# Patient Record
Sex: Female | Born: 1990 | Race: White | Hispanic: No | Marital: Single | Smoking: Current every day smoker
Health system: Southern US, Community
[De-identification: ages and names within clinical notes are randomized; demographics above are authoritative.]

## PROBLEM LIST (undated history)

## (undated) ENCOUNTER — Inpatient Hospital Stay (HOSPITAL_COMMUNITY): Payer: Self-pay

## (undated) DIAGNOSIS — B009 Herpesviral infection, unspecified: Secondary | ICD-10-CM

## (undated) DIAGNOSIS — I493 Ventricular premature depolarization: Secondary | ICD-10-CM

## (undated) HISTORY — PX: OTHER SURGICAL HISTORY: SHX169

---
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
## 2011-07-06 ENCOUNTER — Emergency Department (HOSPITAL_COMMUNITY)
Admission: EM | Admit: 2011-07-06 | Discharge: 2011-07-06 | Disposition: A | Discharge status: Home / Self Care | Payer: Self-pay | Attending: Emergency Medicine | Admitting: Emergency Medicine

## 2011-07-06 DIAGNOSIS — N739 Female pelvic inflammatory disease, unspecified: Secondary | ICD-10-CM | POA: Insufficient documentation

## 2011-07-06 DIAGNOSIS — R109 Unspecified abdominal pain: Secondary | ICD-10-CM | POA: Insufficient documentation

## 2011-07-06 LAB — URINALYSIS, ROUTINE W REFLEX MICROSCOPIC
Bilirubin Urine: NEGATIVE
Hgb urine dipstick: NEGATIVE
Ketones, ur: NEGATIVE mg/dL
Nitrite: NEGATIVE
Protein, ur: NEGATIVE mg/dL
Specific Gravity, Urine: 1.019 (ref 1.005–1.030)
Urobilinogen, UA: 1 mg/dL (ref 0.0–1.0)

## 2011-07-06 LAB — DIFFERENTIAL
Basophils Absolute: 0 10*3/uL (ref 0.0–0.1)
Basophils Relative: 0 % (ref 0–1)
Lymphocytes Relative: 17 % (ref 12–46)
Monocytes Relative: 8 % (ref 3–12)
Neutro Abs: 7.2 10*3/uL (ref 1.7–7.7)
Neutrophils Relative %: 73 % (ref 43–77)

## 2011-07-06 LAB — CBC
Hemoglobin: 12.9 g/dL (ref 12.0–15.0)
MCH: 27.7 pg (ref 26.0–34.0)
MCHC: 32.9 g/dL (ref 30.0–36.0)
MCV: 84.1 fL (ref 78.0–100.0)
Platelets: 288 10*3/uL (ref 150–400)
RBC: 4.66 MIL/uL (ref 3.87–5.11)
RDW: 12.4 % (ref 11.5–15.5)

## 2011-07-06 LAB — POCT I-STAT, CHEM 8
BUN: 14 mg/dL (ref 6–23)
Potassium: 4.4 mEq/L (ref 3.5–5.1)
Sodium: 139 mEq/L (ref 135–145)
TCO2: 26 mmol/L (ref 0–100)

## 2011-07-06 LAB — WET PREP, GENITAL
Trich, Wet Prep: NONE SEEN
Yeast Wet Prep HPF POC: NONE SEEN

## 2011-07-06 LAB — POCT PREGNANCY, URINE: Preg Test, Ur: NEGATIVE

## 2011-07-07 LAB — URINE CULTURE: Culture: NO GROWTH

## 2011-07-08 LAB — GC/CHLAMYDIA PROBE AMP, GENITAL: Chlamydia, DNA Probe: NEGATIVE

## 2012-04-04 ENCOUNTER — Encounter (HOSPITAL_COMMUNITY): Payer: Self-pay | Admitting: Family Medicine

## 2012-04-04 ENCOUNTER — Emergency Department (HOSPITAL_COMMUNITY)
Admission: EM | Admit: 2012-04-04 | Discharge: 2012-04-04 | Disposition: A | Discharge status: Home / Self Care | Payer: Self-pay | Attending: Emergency Medicine | Admitting: Emergency Medicine

## 2012-04-04 DIAGNOSIS — Z79899 Other long term (current) drug therapy: Secondary | ICD-10-CM | POA: Insufficient documentation

## 2012-04-04 DIAGNOSIS — N760 Acute vaginitis: Secondary | ICD-10-CM | POA: Insufficient documentation

## 2012-04-04 DIAGNOSIS — A499 Bacterial infection, unspecified: Secondary | ICD-10-CM | POA: Insufficient documentation

## 2012-04-04 DIAGNOSIS — L293 Anogenital pruritus, unspecified: Secondary | ICD-10-CM | POA: Insufficient documentation

## 2012-04-04 DIAGNOSIS — B9689 Other specified bacterial agents as the cause of diseases classified elsewhere: Secondary | ICD-10-CM | POA: Insufficient documentation

## 2012-04-04 DIAGNOSIS — F17200 Nicotine dependence, unspecified, uncomplicated: Secondary | ICD-10-CM | POA: Insufficient documentation

## 2012-04-04 HISTORY — DX: Herpesviral infection, unspecified: B00.9

## 2012-04-04 LAB — WET PREP, GENITAL
Trich, Wet Prep: NONE SEEN
Yeast Wet Prep HPF POC: NONE SEEN

## 2012-04-04 MED ORDER — METRONIDAZOLE 500 MG PO TABS: 500.0000 mg | ORAL_TABLET | Freq: Two times a day (BID) | ORAL | Status: AC

## 2012-04-04 NOTE — ED Notes (Signed)
Pt sts hx of herpes x1 year. sts had a vaginal exam 2 weeks ago and since has had a terrible outbreak. sts has been taking valtrex. sts also left shoulder pain that started less than one month ago.

## 2012-04-04 NOTE — Discharge Instructions (Signed)
You likely have bacterial vaginosis, an overgrowth of the natural bacteria that colonize the vagina. You do not appear to have an active herpes outbreak at this time. You have been prescribed an antibiotic to treat this. Take ALL of this until gone. Do not drink alcohol while taking this as it will cause you to have a reaction of facial flushing and discomfort. Follow up with the health department if not improving.  Bacterial Vaginosis Bacterial vaginosis (BV) is a vaginal infection where the normal balance of bacteria in the vagina is disrupted. The normal balance is then replaced by an overgrowth of certain bacteria. There are several different kinds of bacteria that can cause BV. BV is the most common vaginal infection in women of childbearing age. CAUSES   The cause of BV is not fully understood. BV develops when there is an increase or imbalance of harmful bacteria.   Some activities or behaviors can upset the normal balance of bacteria in the vagina and put women at increased risk including:   Having a new sex partner or multiple sex partners.   Douching.   Using an intrauterine device (IUD) for contraception.   It is not clear what role sexual activity plays in the development of BV. However, women that have never had sexual intercourse are rarely infected with BV.  Women do not get BV from toilet seats, bedding, swimming pools or from touching objects around them.  SYMPTOMS   Grey vaginal discharge.   A fish-like odor with discharge, especially after sexual intercourse.   Itching or burning of the vagina and vulva.   Burning or pain with urination.   Some women have no signs or symptoms at all.  DIAGNOSIS  Your caregiver must examine the vagina for signs of BV. Your caregiver will perform lab tests and look at the sample of vaginal fluid through a microscope. They will look for bacteria and abnormal cells (clue cells), a pH test higher than 4.5, and a positive amine test all  associated with BV.  RISKS AND COMPLICATIONS   Pelvic inflammatory disease (PID).   Infections following gynecology surgery.   Developing HIV.   Developing herpes virus.  TREATMENT  Sometimes BV will clear up without treatment. However, all women with symptoms of BV should be treated to avoid complications, especially if gynecology surgery is planned. Female partners generally do not need to be treated. However, BV may spread between female sex partners so treatment is helpful in preventing a recurrence of BV.   BV may be treated with antibiotics. The antibiotics come in either pill or vaginal cream forms. Either can be used with nonpregnant or pregnant women, but the recommended dosages differ. These antibiotics are not harmful to the baby.   BV can recur after treatment. If this happens, a second round of antibiotics will often be prescribed.   Treatment is important for pregnant women. If not treated, BV can cause a premature delivery, especially for a pregnant woman who had a premature birth in the past. All pregnant women who have symptoms of BV should be checked and treated.   For chronic reoccurrence of BV, treatment with a type of prescribed gel vaginally twice a week is helpful.  HOME CARE INSTRUCTIONS   Finish all medication as directed by your caregiver.   Do not have sex until treatment is completed.   Tell your sexual partner that you have a vaginal infection. They should see their caregiver and be treated if they have problems, such as  a mild rash or itching.   Practice safe sex. Use condoms. Only have 1 sex partner.  PREVENTION  Basic prevention steps can help reduce the risk of upsetting the natural balance of bacteria in the vagina and developing BV:  Do not have sexual intercourse (be abstinent).   Do not douche.   Use all of the medicine prescribed for treatment of BV, even if the signs and symptoms go away.   Tell your sex partner if you have BV. That way, they  can be treated, if needed, to prevent reoccurrence.  SEEK MEDICAL CARE IF:   Your symptoms are not improving after 3 days of treatment.   You have increased discharge, pain, or fever.  MAKE SURE YOU:   Understand these instructions.   Will watch your condition.   Will get help right away if you are not doing well or get worse.  FOR MORE INFORMATION  Division of STD Prevention (DSTDP), Centers for Disease Control and Prevention: [employer] American Social Health Association (ASHA): www.ashastd.org  Document Released: 10/27/2005 Document Revised: 10/16/2011 Document Reviewed: 04/19/2009 [facility] Patient Information 2012 [place], [place].

## 2012-04-04 NOTE — ED Provider Notes (Signed)
Medical screening examination/treatment/procedure(s) were performed by non-physician practitioner and as supervising physician I was immediately available for consultation/collaboration.   Lyanne Co, MD 04/04/12 1721

## 2012-04-04 NOTE — ED Provider Notes (Signed)
History     CSN: [id number]  Arrival date & time 04/04/12  1230   First MD Initiated Contact with Patient 04/04/12 1327      Chief Complaint  Patient presents with  . Herpes Zoster    (Consider location/radiation/quality/duration/timing/severity/associated sxs/prior treatment) HPI History from patient. 21 year old female with past medical history of genital herpes simplex presents with pain and pruritis to the vaginal area. She states this started about 2 weeks ago after a pelvic exam which was performed at the health department. She thought that she may be having a herpes outbreak so took her typical "outbreak dose" of Valtrex, but has not had any improvement. Has not noted any vaginal discharge. Has not had any associated abdominal pain, nausea, vomiting. No new soaps or detergents. She is sexually active with a female partner who she states has tested negative for STIs.  Past Medical History  Diagnosis Date  . Herpes     History reviewed. No pertinent past surgical history.  History reviewed. No pertinent family history.  History  Substance Use Topics  . Smoking status: Current Some Day Smoker  . Smokeless tobacco: Not on file  . Alcohol Use: Yes    OB History    Grav Para Term Preterm Abortions TAB SAB Ect Mult Living                  Review of Systems as per history of present illness  Allergies  Review of patient's allergies indicates no known allergies.  Home Medications   Current Outpatient Rx  Name Route Sig Dispense Refill  . NAPROXEN SODIUM 220 MG PO TABS Oral Take 220 mg by mouth daily as needed. For back pain    . VALACYCLOVIR HCL 1 G PO TABS Oral Take 500 mg by mouth daily. Increase to 1/2 tablet 2 times daily for outbreaks      BP 123/60  Pulse 55  Temp(Src) 98.5 F (36.9 C) (Oral)  Resp 19  SpO2 99%  LMP 03/21/2012  Physical Exam  Nursing note and vitals reviewed. Constitutional: She appears well-developed and well-nourished. No distress.   HENT:  Head: Normocephalic and atraumatic.  Neck: Normal range of motion.  Cardiovascular: Normal rate, regular rhythm and normal heart sounds.   Pulmonary/Chest: Effort normal and breath sounds normal.  Abdominal: Soft. Bowel sounds are normal. There is no tenderness. There is no rebound and no guarding.  Genitourinary:       RN chaperone present during exam  No active genital lesions on exam. External vaginal mucosa appears mildly dry. Small amount of physiologic-appearing discharge in vaginal vault. Cervix is not erythematous.  Musculoskeletal: Normal range of motion.  Neurological: She is alert.  Skin: Skin is warm and dry. She is not diaphoretic.  Psychiatric: She has a normal mood and affect.    ED Course  Procedures (including critical care time)  Labs Reviewed - No data to display No results found.   1. BV (bacterial vaginosis)       MDM  Patient with history of herpes simplex of the genitals presents with vaginal itching and pain. On exam, she is not noted to have an active herpes outbreak, but the external genitalia appear mildly dry. Wet prep with few clue cells. Will treat as possible BV. Instructed to followup with the health department if not improving. Reasons to return to the emergency department discussed.        Grant Fontana, [place] 04/04/12 1529

## 2012-11-06 ENCOUNTER — Encounter (HOSPITAL_COMMUNITY): Payer: Self-pay | Admitting: Emergency Medicine

## 2012-11-06 ENCOUNTER — Emergency Department (HOSPITAL_COMMUNITY)
Admission: EM | Admit: 2012-11-06 | Discharge: 2012-11-07 | Disposition: A | Discharge status: Home / Self Care | Payer: Self-pay | Attending: Emergency Medicine | Admitting: Emergency Medicine

## 2012-11-06 DIAGNOSIS — F17200 Nicotine dependence, unspecified, uncomplicated: Secondary | ICD-10-CM | POA: Insufficient documentation

## 2012-11-06 DIAGNOSIS — R112 Nausea with vomiting, unspecified: Secondary | ICD-10-CM | POA: Insufficient documentation

## 2012-11-06 DIAGNOSIS — B009 Herpesviral infection, unspecified: Secondary | ICD-10-CM | POA: Insufficient documentation

## 2012-11-06 DIAGNOSIS — Z3202 Encounter for pregnancy test, result negative: Secondary | ICD-10-CM | POA: Insufficient documentation

## 2012-11-06 DIAGNOSIS — Z79899 Other long term (current) drug therapy: Secondary | ICD-10-CM | POA: Insufficient documentation

## 2012-11-06 LAB — POCT PREGNANCY, URINE: Preg Test, Ur: NEGATIVE

## 2012-11-06 NOTE — ED Notes (Signed)
Pt states she thinks she might be pregnant   Pt states she is having sxs like she is  Pt is requesting to have a blood test for it   Pt states her sxs include nausea, vomiting, back ache, shaking, breast enlargement

## 2012-11-07 LAB — COMPREHENSIVE METABOLIC PANEL
ALT: 16 U/L (ref 0–35)
AST: 17 U/L (ref 0–37)
Albumin: 4 g/dL (ref 3.5–5.2)
Alkaline Phosphatase: 48 U/L (ref 39–117)
CO2: 23 mEq/L (ref 19–32)
Chloride: 103 mEq/L (ref 96–112)
Potassium: 3.7 mEq/L (ref 3.5–5.1)
Total Bilirubin: 0.4 mg/dL (ref 0.3–1.2)

## 2012-11-07 LAB — CBC WITH DIFFERENTIAL/PLATELET
Basophils Absolute: 0 10*3/uL (ref 0.0–0.1)
Basophils Relative: 0 % (ref 0–1)
Hemoglobin: 14.5 g/dL (ref 12.0–15.0)
Lymphocytes Relative: 26 % (ref 12–46)
MCHC: 35.2 g/dL (ref 30.0–36.0)
Neutro Abs: 6.2 10*3/uL (ref 1.7–7.7)
Neutrophils Relative %: 61 % (ref 43–77)
RDW: 12.9 % (ref 11.5–15.5)
WBC: 10.2 10*3/uL (ref 4.0–10.5)

## 2012-11-07 LAB — URINALYSIS, ROUTINE W REFLEX MICROSCOPIC
Bilirubin Urine: NEGATIVE
Hgb urine dipstick: NEGATIVE
Ketones, ur: NEGATIVE mg/dL
Nitrite: NEGATIVE
Specific Gravity, Urine: 1.01 (ref 1.005–1.030)
Urobilinogen, UA: 0.2 mg/dL (ref 0.0–1.0)

## 2012-11-07 LAB — WET PREP, GENITAL: Trich, Wet Prep: NONE SEEN

## 2012-11-07 MED ORDER — PROMETHAZINE HCL 25 MG PO TABS: 25.0000 mg | ORAL_TABLET | Freq: Four times a day (QID) | ORAL | Status: DC | PRN

## 2012-11-07 MED ORDER — ONDANSETRON 8 MG PO TBDP
8.0000 mg | ORAL_TABLET | Freq: Once | ORAL | Status: AC
  Administered 2012-11-07: 8 mg via ORAL
  Filled 2012-11-07: qty 1

## 2012-11-07 MED ORDER — GI COCKTAIL ~~LOC~~
30.0000 mL | Freq: Once | ORAL | Status: AC
  Administered 2012-11-07: 30 mL via ORAL
  Filled 2012-11-07: qty 30

## 2012-11-07 NOTE — ED Provider Notes (Signed)
History     CSN: [id number]  Arrival date & time 11/06/12  2141   First MD Initiated Contact with Patient 11/07/12 0016      Chief Complaint  Patient presents with  . Vomiting     (Consider location/radiation/quality/duration/timing/severity/associated sxs/prior treatment) HPI History provided by pt.   Pt c/o nausea and several episodes of vomiting over the past 2 weeks.  Occurs whenever she smells food.  Believes she may be pregnant.  Associated w/ mid-line and left lower abdominal pain, diffuse low back pain, as well as blood in stool (on tp only).  Attributes blood in stool to perianal irritation from frequent BMs and wiping.  Denies fever, hematemesis, diarrhea.  Has had increased urinary frequency and dysuria, but attributes dysuria to herpes breakout.  LMP 09/15/12 and periods normally regular.  No other GU sx.   Past Medical History  Diagnosis Date  . Herpes     Past Surgical History  Procedure Date  . Extraction of wisdom teeth     History reviewed. No pertinent family history.  History  Substance Use Topics  . Smoking status: Current Every Day Smoker    Types: Cigarettes  . Smokeless tobacco: Not on file  . Alcohol Use: Yes     Comment: occ    OB History    Grav Para Term Preterm Abortions TAB SAB Ect Mult Living                  Review of Systems  All other systems reviewed and are negative.    Allergies  Review of patient's allergies indicates no known allergies.  Home Medications   Current Outpatient Rx  Name  Route  Sig  Dispense  Refill  . NAPROXEN SODIUM 220 MG PO TABS   Oral   Take 220 mg by mouth daily as needed. For back pain         . VALACYCLOVIR HCL 1 G PO TABS   Oral   Take 500 mg by mouth daily. Increase to 1/2 tablet 2 times daily for outbreaks           BP 138/73  Pulse 104  Temp 98.8 F (37.1 C) (Oral)  Resp 16  SpO2 99%  LMP 09/19/2012  Physical Exam  Nursing note and vitals reviewed. Constitutional: She is  oriented to person, place, and time. She appears well-developed and well-nourished. No distress.  HENT:  Head: Normocephalic and atraumatic.  Eyes:       Normal appearance  Neck: Normal range of motion.  Cardiovascular: Normal rate and regular rhythm.   Pulmonary/Chest: Effort normal and breath sounds normal. No respiratory distress.  Abdominal: Soft. Bowel sounds are normal. She exhibits no distension and no mass. There is no rebound and no guarding.       Mild epigastric, suprapubic and LLQ ttp.    Genitourinary:       Mild R CVA tenderness.  No external hemorrhoids. Nml rectal tone.  No rectal ttp.  Nml stool color w/out gross blood.    Musculoskeletal: Normal range of motion.  Neurological: She is alert and oriented to person, place, and time.  Skin: Skin is warm and dry. No rash noted.  Psychiatric: She has a normal mood and affect. Her behavior is normal.    ED Course  Procedures (including critical care time)  Labs Reviewed  URINALYSIS, ROUTINE W REFLEX MICROSCOPIC - Abnormal; Notable for the following:    APPearance CLOUDY (*)     All other  components within normal limits  WET PREP, GENITAL - Abnormal; Notable for the following:    Clue Cells Wet Prep HPF POC RARE (*)     WBC, Wet Prep HPF POC FEW (*)     All other components within normal limits  POCT PREGNANCY, URINE  CBC WITH DIFFERENTIAL  COMPREHENSIVE METABOLIC PANEL  LIPASE, BLOOD  GC/CHLAMYDIA PROBE AMP   No results found.   1. Nausea and vomiting       MDM  Healthy 21yo F presents w/ N/V, lower abdominal pain, hematochezia and amenorrhea.  Believes she may be pregnant.  On exam, afebrile, mildly tachycardic, mild epigastric/suprapubic/LLQ ttp, unremarkable genitalia and nml rectum.  No vomiting in ED and sx improvement w/ GI cocktail and zofran.  Labs unremarkable, including neg hemoccult, U/A and urine preg.  Results discussed w/ pt.  Recommended repeat home pregnancy test in 1-2 weeks.  Prescribed  promethazine.  Referred to GI and Gyn.  Return precautions discussed.         Otilio Miu, PA-C 11/07/12 4540  Otilio Miu, PA-C 11/07/12 [phone]

## 2012-11-07 NOTE — ED Provider Notes (Signed)
Medical screening examination/treatment/procedure(s) were performed by non-physician practitioner and as supervising physician I was immediately available for consultation/collaboration.   Shariah Assad L Kaito Schulenburg, MD 11/07/12 0731 

## 2012-11-08 LAB — GC/CHLAMYDIA PROBE AMP
CT Probe RNA: NEGATIVE
GC Probe RNA: NEGATIVE

## 2012-12-15 ENCOUNTER — Emergency Department (HOSPITAL_COMMUNITY)
Admission: EM | Admit: 2012-12-15 | Discharge: 2012-12-15 | Disposition: A | Discharge status: Home / Self Care | Payer: Self-pay | Attending: Emergency Medicine | Admitting: Emergency Medicine

## 2012-12-15 ENCOUNTER — Encounter (HOSPITAL_COMMUNITY): Payer: Self-pay | Admitting: *Deleted

## 2012-12-15 DIAGNOSIS — F17200 Nicotine dependence, unspecified, uncomplicated: Secondary | ICD-10-CM | POA: Insufficient documentation

## 2012-12-15 DIAGNOSIS — R229 Localized swelling, mass and lump, unspecified: Secondary | ICD-10-CM | POA: Insufficient documentation

## 2012-12-15 DIAGNOSIS — B009 Herpesviral infection, unspecified: Secondary | ICD-10-CM | POA: Insufficient documentation

## 2012-12-15 DIAGNOSIS — B002 Herpesviral gingivostomatitis and pharyngotonsillitis: Secondary | ICD-10-CM

## 2012-12-15 DIAGNOSIS — Z8619 Personal history of other infectious and parasitic diseases: Secondary | ICD-10-CM | POA: Insufficient documentation

## 2012-12-15 MED ORDER — ACYCLOVIR 5 % EX OINT: TOPICAL_OINTMENT | CUTANEOUS | Status: DC

## 2012-12-15 NOTE — ED Provider Notes (Signed)
History     CSN: [id number]  Arrival date & time 12/15/12  1246   First MD Initiated Contact with Patient 12/15/12 1248      Chief Complaint  Patient presents with  . Oral Swelling  . Rash    (Consider location/radiation/quality/duration/timing/severity/associated sxs/prior treatment) HPI Comments: 22 year old female presents to the emergency department complaining of swelling and a rash to the left side of her lower lip. She had her tongue pierced yesterday and believes this is the reason. Admits to a history of genital herpes. Denies tongue swelling or difficulty swallowing. No new soaps, detergents, pets or chemical exposures. She has not tried any alleviating factors for the rash. Admits to being under increased stress at this time. Denies genital lesions.  Patient is a 22 y.o. female presenting with rash. The history is provided by the patient.  Rash     Past Medical History  Diagnosis Date  . Herpes     Past Surgical History  Procedure Date  . Extraction of wisdom teeth     No family history on file.  History  Substance Use Topics  . Smoking status: Current Every Day Smoker    Types: Cigarettes  . Smokeless tobacco: Not on file  . Alcohol Use: Yes     Comment: occ    OB History    Grav Para Term Preterm Abortions TAB SAB Ect Mult Living                  Review of Systems  HENT: Positive for facial swelling. Negative for trouble swallowing.   Respiratory: Negative for chest tightness and shortness of breath.   Skin: Positive for rash.  All other systems reviewed and are negative.    Allergies  Review of patient's allergies indicates no known allergies.  Home Medications   Current Outpatient Rx  Name  Route  Sig  Dispense  Refill  . BIOTIN 2500 MCG PO CAPS   Oral   Take 2 capsules by mouth daily.         . ACYCLOVIR 5 % EX OINT   Topical   Apply topically every 3 (three) hours.   30 g   0     BP 140/71  Pulse 86  Temp 98.6 F (37 C)  (Oral)  Resp 20  SpO2 98%  LMP 12/14/2012  Physical Exam  Nursing note and vitals reviewed. Constitutional: She is oriented to person, place, and time. She appears well-developed and well-nourished. No distress.  HENT:  Head: Normocephalic and atraumatic.  Mouth/Throat: Uvula is midline, oropharynx is clear and moist and mucous membranes are normal. No uvula swelling.    Eyes: Conjunctivae normal and EOM are normal.  Neck: Normal range of motion. Neck supple.  Cardiovascular: Normal rate, regular rhythm and normal heart sounds.   Pulmonary/Chest: Effort normal and breath sounds normal.  Musculoskeletal: Normal range of motion. She exhibits no edema.  Lymphadenopathy:       Head (right side): No submental and no submandibular adenopathy present.       Head (left side): No submental and no submandibular adenopathy present.    She has no cervical adenopathy.  Neurological: She is alert and oriented to person, place, and time.  Skin: Skin is warm and dry.  Psychiatric: She has a normal mood and affect. Her behavior is normal.    ED Course  Procedures (including critical care time)  Labs Reviewed - No data to display No results found.   1. Oral herpes  MDM  22 year old female with oral herpes. Zovirax ointment prescribed. No tongue swelling or airway compromise. Infection care precautions discussed. Return cautions discussed. Patient states her understanding of plan and is agreeable.        Trevor Mace, PA-C 12/15/12 1338

## 2012-12-15 NOTE — ED Provider Notes (Signed)
  Medical screening examination/treatment/procedure(s) were performed by non-physician practitioner and as supervising physician I was immediately available for consultation/collaboration.    Gerhard Munch, MD 12/15/12 1500

## 2012-12-15 NOTE — ED Notes (Signed)
Pt reports having her tongue pierced yesterday, woke up with swelling and rash on her lower lip.  Pt states that she is worried that it could be "type I herpes."  Has hx of "type II herpes."

## 2013-01-04 ENCOUNTER — Emergency Department (HOSPITAL_COMMUNITY)
Admission: EM | Admit: 2013-01-04 | Discharge: 2013-01-04 | Disposition: A | Discharge status: Home / Self Care | Payer: Self-pay

## 2013-02-22 ENCOUNTER — Encounter (HOSPITAL_COMMUNITY): Payer: Self-pay

## 2013-02-22 ENCOUNTER — Emergency Department (HOSPITAL_COMMUNITY)
Admission: EM | Admit: 2013-02-22 | Discharge: 2013-02-22 | Disposition: A | Discharge status: Home / Self Care | Payer: Self-pay | Attending: Emergency Medicine | Admitting: Emergency Medicine

## 2013-02-22 ENCOUNTER — Emergency Department (HOSPITAL_COMMUNITY): Payer: Self-pay

## 2013-02-22 DIAGNOSIS — IMO0002 Reserved for concepts with insufficient information to code with codable children: Secondary | ICD-10-CM | POA: Insufficient documentation

## 2013-02-22 DIAGNOSIS — M436 Torticollis: Secondary | ICD-10-CM | POA: Insufficient documentation

## 2013-02-22 DIAGNOSIS — K089 Disorder of teeth and supporting structures, unspecified: Secondary | ICD-10-CM | POA: Insufficient documentation

## 2013-02-22 DIAGNOSIS — R209 Unspecified disturbances of skin sensation: Secondary | ICD-10-CM | POA: Insufficient documentation

## 2013-02-22 DIAGNOSIS — F17200 Nicotine dependence, unspecified, uncomplicated: Secondary | ICD-10-CM | POA: Insufficient documentation

## 2013-02-22 MED ORDER — METHOCARBAMOL 500 MG PO TABS: 500.0000 mg | ORAL_TABLET | Freq: Two times a day (BID) | ORAL | Status: DC

## 2013-02-22 MED ORDER — IBUPROFEN 600 MG PO TABS: 600.0000 mg | ORAL_TABLET | Freq: Four times a day (QID) | ORAL | Status: DC | PRN

## 2013-02-22 MED ORDER — IBUPROFEN 800 MG PO TABS
800.0000 mg | ORAL_TABLET | Freq: Once | ORAL | Status: AC
  Administered 2013-02-22: 800 mg via ORAL
  Filled 2013-02-22: qty 1

## 2013-02-22 MED ORDER — METHOCARBAMOL 500 MG PO TABS
500.0000 mg | ORAL_TABLET | Freq: Once | ORAL | Status: AC
  Administered 2013-02-22: 500 mg via ORAL
  Filled 2013-02-22: qty 1
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
## 2013-02-23 NOTE — ED Provider Notes (Signed)
Medical screening examination/treatment/procedure(s) were performed by non-physician practitioner and as supervising physician I was immediately available for consultation/collaboration.   Ireland Virrueta L Kerby Borner, MD 02/23/13 1032 

## 2013-04-12 ENCOUNTER — Encounter (HOSPITAL_COMMUNITY): Payer: Self-pay | Admitting: Emergency Medicine

## 2013-04-12 ENCOUNTER — Emergency Department (HOSPITAL_COMMUNITY)
Admission: EM | Admit: 2013-04-12 | Discharge: 2013-04-12 | Disposition: A | Discharge status: Home / Self Care | Payer: Self-pay | Attending: Emergency Medicine | Admitting: Emergency Medicine

## 2013-04-12 DIAGNOSIS — Z79899 Other long term (current) drug therapy: Secondary | ICD-10-CM | POA: Insufficient documentation

## 2013-04-12 DIAGNOSIS — J989 Respiratory disorder, unspecified: Secondary | ICD-10-CM | POA: Insufficient documentation

## 2013-04-12 DIAGNOSIS — R059 Cough, unspecified: Secondary | ICD-10-CM | POA: Insufficient documentation

## 2013-04-12 DIAGNOSIS — B009 Herpesviral infection, unspecified: Secondary | ICD-10-CM | POA: Insufficient documentation

## 2013-04-12 DIAGNOSIS — J988 Other specified respiratory disorders: Secondary | ICD-10-CM

## 2013-04-12 DIAGNOSIS — F17200 Nicotine dependence, unspecified, uncomplicated: Secondary | ICD-10-CM | POA: Insufficient documentation

## 2013-04-12 DIAGNOSIS — R05 Cough: Secondary | ICD-10-CM | POA: Insufficient documentation

## 2013-04-12 LAB — RAPID STREP SCREEN (MED CTR MEBANE ONLY): Streptococcus, Group A Screen (Direct): NEGATIVE
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
## 2013-04-12 NOTE — Progress Notes (Signed)
[facility] did see patient and provided her with a list of primary care resources.

## 2013-04-12 NOTE — ED Provider Notes (Signed)
Medical screening examination/treatment/procedure(s) were performed by non-physician practitioner and as supervising physician I was immediately available for consultation/collaboration.   Rey Dansby J. Ryla Cauthon, MD 04/12/13 1432 

## 2013-04-12 NOTE — ED Provider Notes (Signed)
History     CSN: [id number]  Arrival date & time 04/12/13  1008   First MD Initiated Contact with Patient 04/12/13 1058      Chief Complaint  Patient presents with  . Sore Throat    (Consider location/radiation/quality/duration/timing/severity/associated sxs/prior treatment) HPI Comments: Patient reports sore throat that began last night. Pt has had productive cough x 1 week.  Denies fevers, chills, myalgias, SOB, difficulty swallowing, nasal congestion, rhinorrhea, ear pain.  Has sick contacts at home with similar symptoms.  Pt came to ED because she was concerned she had throat cancer because she smokes cigarettes  1ppd.    Patient is a 22 y.o. female presenting with pharyngitis. The history is provided by the patient.  Sore Throat Associated symptoms include coughing and a sore throat. Pertinent negatives include no chest pain, chills, congestion or fever.    Past Medical History  Diagnosis Date  . Herpes     Past Surgical History  Procedure Laterality Date  . Extraction of wisdom teeth      No family history on file.  History  Substance Use Topics  . Smoking status: Current Every Day Smoker -- 1.00 packs/day    Types: Cigarettes  . Smokeless tobacco: Not on file  . Alcohol Use: Yes     Comment: occ    OB History   Grav Para Term Preterm Abortions TAB SAB Ect Mult Living                  Review of Systems  Constitutional: Negative for fever and chills.  HENT: Positive for sore throat. Negative for ear pain, congestion, rhinorrhea, sneezing, mouth sores, trouble swallowing and voice change.   Respiratory: Positive for cough. Negative for shortness of breath.   Cardiovascular: Negative for chest pain.    Allergies  Review of patient's allergies indicates no known allergies.  Home Medications   Current Outpatient Rx  Name  Route  Sig  Dispense  Refill  . Biotin ([facility] BIOTIN) 5 MG CAPS   Oral   Take 1 capsule by mouth daily.         . valACYclovir  (VALTREX) 500 MG tablet   Oral   Take 500 mg by mouth daily.          . vitamin B-12 (CYANOCOBALAMIN) 100 MCG tablet   Oral   Take 50 mcg by mouth daily.           BP 110/53  Pulse 73  Temp(Src) 98.6 F (37 C) (Oral)  Resp 16  SpO2 100%  LMP 03/12/2013  Physical Exam  Nursing note and vitals reviewed. Constitutional: She appears well-developed and well-nourished. No distress.  HENT:  Head: Normocephalic and atraumatic.  Mouth/Throat: Uvula is midline. Mucous membranes are not dry. No edematous. Oropharyngeal exudate and posterior oropharyngeal erythema present. No posterior oropharyngeal edema or tonsillar abscesses.  Eyes: Conjunctivae are normal. Right eye exhibits no discharge. Left eye exhibits no discharge. No scleral icterus.  Neck: Normal range of motion. Neck supple.  Cardiovascular: Normal rate and regular rhythm.   Pulmonary/Chest: Effort normal and breath sounds normal. No stridor. No respiratory distress. She has no wheezes. She has no rales.  Lymphadenopathy:    She has no cervical adenopathy.  Neurological: She is alert.  Skin: She is not diaphoretic.    ED Course  Procedures (including critical care time)  Labs Reviewed  RAPID STREP SCREEN  CULTURE, GROUP A STREP   No results found.  Counseled patient to stop  smoking.    1. Viral respiratory illness     MDM  Pt with cough x 5 days, sore throat x 1 day.  No SOB.  Lungs CTAB. Pharynx with exudate but pt is afebrile, has a cough, has no cervical lymphadenopathy.  Strep screen negative.  Likely viral infection.  Pt advised to quit smoking.  Discussed all results with patient.  Pt given return precautions.  Pt verbalizes understanding and agrees with plan.     I doubt any other EMC precluding discharge at this time including, but not necessarily limited to the following:  Peritonsillar abscess, deep space neck infection, pneumonia        Trixie Dredge, PA-C 04/12/13 1212

## 2013-04-15 LAB — CULTURE, GROUP A STREP

## 2013-04-16 ENCOUNTER — Telehealth (HOSPITAL_COMMUNITY): Payer: Self-pay | Admitting: Emergency Medicine

## 2013-04-16 NOTE — Progress Notes (Signed)
  ED Antimicrobial Stewardship Positive Culture Follow Up   Tammy Rivers is an 22 y.o. female who presented to [facility] on 04/12/2013 with a chief complaint of sore throat and cough. Chief Complaint  Patient presents with  . Sore Throat    Recent Results (from the past 720 hour(s))  RAPID STREP SCREEN     Status: None   Collection Time    04/12/13 11:32 AM      Result Value Range Status   Streptococcus, Group A Screen (Direct) NEGATIVE  NEGATIVE Final   Comment: (NOTE)     A Rapid Antigen test may result negative if the antigen level in the     sample is below the detection level of this test. The FDA has not     cleared this test as a stand-alone test therefore the rapid antigen     negative result has reflexed to a Group A Strep culture.  CULTURE, GROUP A STREP     Status: None   Collection Time    04/12/13 11:32 AM      Result Value Range Status   Specimen Description THROAT   Final   Special Requests NONE   Final   Culture STREPTOCOCCUS,BETA HEMOLYIC NOT GROUP A   Final   Report Status 04/15/2013 FINAL   Final    []  Treated with , organism resistant to prescribed antimicrobial [x]  Patient discharged originally without antimicrobial agent and treatment is now indicated  Recommendation: Perform symptom check. If pharyngitis symptoms persist, see antibiotic below.  New antibiotic prescription: Amoxicillin 500mg  po BID x 10 days  ED Provider: Fayrene Helper, PA-C   Cleon Dew 04/16/2013, 5:44 PM Infectious Diseases Pharmacist Phone# [phone]

## 2013-04-16 NOTE — ED Notes (Signed)
Post ED Visit - Positive Culture Follow-up: Successful Patient Follow-Up  Culture assessed and recommendations reviewed by: [x]  Wes Dulaney, Pharm.D., BCPS []  Celedonio Miyamoto, [address].D., BCPS []  Georgina Pillion, Pharm.D., BCPS []  [place], [place].D., BCPS, AAHIVP []  Estella Husk, Pharm.D., BCPS, AAHIVP  Positive Group A Strep culture  [x]  Patient discharged without antimicrobial prescription and treatment is now indicated []  Organism is resistant to prescribed ED discharge antimicrobial []  Patient with positive blood cultures  Changes discussed with ED provider: Fayrene Helper PA-C New antibiotic prescription: Amoxicillin 500 mg PO BID x 10 days    Kylie A Holland 04/16/2013, 4:34 PM

## 2013-04-17 ENCOUNTER — Telehealth (HOSPITAL_COMMUNITY): Payer: Self-pay | Admitting: Emergency Medicine

## 2013-04-19 ENCOUNTER — Telehealth (HOSPITAL_COMMUNITY): Payer: Self-pay | Admitting: Emergency Medicine

## 2013-04-21 ENCOUNTER — Telehealth (HOSPITAL_COMMUNITY): Payer: Self-pay | Admitting: Emergency Medicine

## 2013-04-21 NOTE — ED Notes (Signed)
Unable to contact patient via phone. Sent letter. °

## 2013-04-25 ENCOUNTER — Encounter (HOSPITAL_COMMUNITY): Payer: Self-pay | Admitting: *Deleted

## 2013-04-25 ENCOUNTER — Emergency Department (HOSPITAL_COMMUNITY)
Admission: EM | Admit: 2013-04-25 | Discharge: 2013-04-25 | Disposition: A | Discharge status: Home / Self Care | Payer: Self-pay | Attending: Emergency Medicine | Admitting: Emergency Medicine

## 2013-04-25 DIAGNOSIS — Z8619 Personal history of other infectious and parasitic diseases: Secondary | ICD-10-CM | POA: Insufficient documentation

## 2013-04-25 DIAGNOSIS — Y9289 Other specified places as the place of occurrence of the external cause: Secondary | ICD-10-CM | POA: Insufficient documentation

## 2013-04-25 DIAGNOSIS — F17200 Nicotine dependence, unspecified, uncomplicated: Secondary | ICD-10-CM | POA: Insufficient documentation

## 2013-04-25 DIAGNOSIS — S60469A Insect bite (nonvenomous) of unspecified finger, initial encounter: Secondary | ICD-10-CM | POA: Insufficient documentation

## 2013-04-25 DIAGNOSIS — W57XXXA Bitten or stung by nonvenomous insect and other nonvenomous arthropods, initial encounter: Secondary | ICD-10-CM | POA: Insufficient documentation

## 2013-04-25 DIAGNOSIS — S1096XA Insect bite of unspecified part of neck, initial encounter: Secondary | ICD-10-CM | POA: Insufficient documentation

## 2013-04-25 DIAGNOSIS — Y9389 Activity, other specified: Secondary | ICD-10-CM | POA: Insufficient documentation

## 2013-04-25 MED ORDER — DEXAMETHASONE SODIUM PHOSPHATE 10 MG/ML IJ SOLN
10.0000 mg | Freq: Once | INTRAMUSCULAR | Status: AC
  Administered 2013-04-25: 10 mg via INTRAMUSCULAR
  Filled 2013-04-25: qty 1

## 2013-04-25 MED ORDER — NAPROXEN 500 MG PO TABS: 500.0000 mg | ORAL_TABLET | Freq: Two times a day (BID) | ORAL | Status: DC

## 2013-04-25 NOTE — ED Provider Notes (Signed)
Medical screening examination/treatment/procedure(s) were performed by non-physician practitioner and as supervising physician I was immediately available for consultation/collaboration.  Ethelda Chick, MD 04/25/13 [phone]

## 2013-04-25 NOTE — ED Notes (Signed)
Pt reports that she was stung by a bee x 2 this afternoon; one sting was to right 5th finger and the other to top of head; pt states that the one on her finger is fine but states that she is having pain shooting down her head to her neck from the other sting and that it hurts to lean head all the way back; denies difficulty breathing, swelling, itching or hives.

## 2013-04-25 NOTE — ED Provider Notes (Signed)
History  This chart was scribed for Emryn Flanery working with Ethelda Chick, MD by Ardelia Mems, ED Scribe. This patient was seen in room [facility]TR1/[facility]PT1 and the patient's care was started at 11:30 PM.   CSN: [id number]  Arrival date & time 04/25/13  2256     Chief Complaint  Patient presents with  . Insect Bite     The history is provided by the patient. No language interpreter was used.    HPI Comments: Tammy Rivers is a 22 y.o. female who presents to the Emergency Department complaining of 2 insect bites that occurred earlier tday. Pt states that she was stung by 2 bees, one on her right fifth finger and another on the back of her scalp. Pt states that there is associated pain at the site of only the scalp sting that radiates to her neck. Pt states that her neck hurts when she lifts her head. Pt states that she is not allergic to bees as far as she knows. Pt denies difficulty breathing, itching, hives or any other symptoms.   Past Medical History  Diagnosis Date  . Herpes     Past Surgical History  Procedure Laterality Date  . Extraction of wisdom teeth      No family history on file.  History  Substance Use Topics  . Smoking status: Current Every Day Smoker -- 1.00 packs/day    Types: Cigarettes  . Smokeless tobacco: Not on file  . Alcohol Use: Yes     Comment: occ    OB History   Grav Para Term Preterm Abortions TAB SAB Ect Mult Living                  Review of Systems As per HPI  Allergies  Review of patient's allergies indicates no known allergies.  Home Medications   Current Outpatient Rx  Name  Route  Sig  Dispense  Refill  . Biotin ([employer] BIOTIN) 5 MG CAPS   Oral   Take 1 capsule by mouth daily.         . valACYclovir (VALTREX) 500 MG tablet   Oral   Take 500 mg by mouth daily.          . vitamin B-12 (CYANOCOBALAMIN) 1000 MCG tablet   Oral   Take 1,000 mcg by mouth daily.           Triage Vitals: BP 132/73  Pulse 88  Temp(Src) 98.2 F  (36.8 C) (Oral)  Resp 20  Ht 5\' 2"  (1.575 m)  Wt 240 lb (108.863 kg)  BMI 43.89 kg/m2  SpO2 99%  LMP 04/20/2013  Physical Exam  Nursing note and vitals reviewed. Constitutional: She is oriented to person, place, and time. She appears well-developed and well-nourished. No distress.  HENT:  Head: Normocephalic and atraumatic.  Mouth/Throat: Oropharynx is clear and moist and mucous membranes are normal.  No sign of airway obstruction. No edema of face, eyelids, lips, tongue, uvula.Marland Kitchen Uvula midline, no nasal congestion or drooling.  Tongue not elevated. No trismus.  Neck: Trachea normal, normal range of motion and full passive range of motion without pain. Neck supple. Carotid bruit is not present. No tracheal deviation present.  Full nl ROM. No carotid bruits or stridor  Cardiovascular: Normal rate, regular rhythm, intact distal pulses and normal pulses.   Not tachycardic  Pulmonary/Chest: Effort normal. No stridor.  Musculoskeletal: Normal range of motion.  Neurological: She is alert and oriented to person, place, and time.  Skin:  Skin is warm and intact. She is not diaphoretic.  Not diaphoretic. No Uriticaria petechiae or purpura. Mild swelling with center sting area  Psychiatric: She has a normal mood and affect. Her behavior is normal.    ED Course  Procedures (including critical care time)  DIAGNOSTIC STUDIES: Oxygen Saturation is 99% on RA, normal by my interpretation.    COORDINATION OF CARE: 11:37 PM- Pt advised of plan for treatment with dexamethasone and naproxen and pt agrees.  Medications  dexamethasone (DECADRON) injection 10 mg (not administered)      Labs Reviewed - No data to display No results found.   No diagnosis found.    MDM    Pt reportedly stung by a bee, no severe reaction found, only localized swelling and tenderness. No stridor. Due to location of sting being back of neck, mild swelling, decadron given. Advised to ice, take Naproxen and  follow-up with PCP.    I personally performed the services described in this documentation, which was scribed in my presence. The recorded information has been reviewed and is accurate.     Jaci Carrel, [place] 04/25/13 2345

## 2013-05-21 ENCOUNTER — Telehealth (HOSPITAL_COMMUNITY): Payer: Self-pay | Admitting: Emergency Medicine

## 2013-05-21 NOTE — ED Notes (Signed)
No response to letter sent after 30 days. Chart sent to Medical Records. °

## 2013-08-02 ENCOUNTER — Emergency Department (HOSPITAL_COMMUNITY)
Admission: EM | Admit: 2013-08-02 | Discharge: 2013-08-02 | Disposition: A | Discharge status: Home / Self Care | Payer: No Typology Code available for payment source | Attending: Emergency Medicine | Admitting: Emergency Medicine

## 2013-08-02 ENCOUNTER — Encounter (HOSPITAL_COMMUNITY): Payer: Self-pay | Admitting: Emergency Medicine

## 2013-08-02 DIAGNOSIS — F17200 Nicotine dependence, unspecified, uncomplicated: Secondary | ICD-10-CM | POA: Insufficient documentation

## 2013-08-02 DIAGNOSIS — R51 Headache: Secondary | ICD-10-CM | POA: Insufficient documentation

## 2013-08-02 DIAGNOSIS — Z8619 Personal history of other infectious and parasitic diseases: Secondary | ICD-10-CM | POA: Insufficient documentation

## 2013-08-02 DIAGNOSIS — R5383 Other fatigue: Secondary | ICD-10-CM

## 2013-08-02 DIAGNOSIS — I493 Ventricular premature depolarization: Secondary | ICD-10-CM

## 2013-08-02 DIAGNOSIS — Z79899 Other long term (current) drug therapy: Secondary | ICD-10-CM | POA: Insufficient documentation

## 2013-08-02 DIAGNOSIS — L259 Unspecified contact dermatitis, unspecified cause: Secondary | ICD-10-CM | POA: Insufficient documentation

## 2013-08-02 DIAGNOSIS — R220 Localized swelling, mass and lump, head: Secondary | ICD-10-CM | POA: Insufficient documentation

## 2013-08-02 DIAGNOSIS — I4949 Other premature depolarization: Secondary | ICD-10-CM | POA: Insufficient documentation

## 2013-08-02 DIAGNOSIS — R5381 Other malaise: Secondary | ICD-10-CM | POA: Insufficient documentation

## 2013-08-02 DIAGNOSIS — R42 Dizziness and giddiness: Secondary | ICD-10-CM | POA: Insufficient documentation

## 2013-08-02 NOTE — ED Notes (Addendum)
Pt reports noticing a knot to the back of her head that has caused shooting pains to the R side of her head and has been feeling fatigued and dizzy with n/v. Pt reports no known cause of injury.

## 2013-08-02 NOTE — ED Provider Notes (Signed)
CSN: [id number]     Arrival date & time 08/02/13  2016 History   First MD Initiated Contact with Patient 08/02/13 2209     Chief Complaint  Patient presents with  . Headache  . Fatigue   (Consider location/radiation/quality/duration/timing/severity/associated sxs/prior Treatment) HPI Comments: Patient is a 22 year old female with history of herpes who presents today with 2 weeks of fatigue and lightheadedness. She reports that today she noticed a knot on the back of her head. 2 weeks ago she was having her friend dye her hair. It did not go well and her hair began to smoke. The knot on her head is sharp and worse with palpation. She has not had anything to make the pain better. She reports that when she is lightheaded it does not last long and it is self limited. She drinks a lot of water. Nothing seems to trigger her symptoms including changing position. The patient reports that she has been under a significant amount of stress over the past 2 weeks due to an upcoming court case. She denies fever, chills, nausea, vomiting, abdominal pain, numbness, weakness, shortness of breath, pallor, melena, hematochezia.   The history is provided by the patient. No language interpreter was used.    Past Medical History  Diagnosis Date  . Herpes    Past Surgical History  Procedure Laterality Date  . Extraction of wisdom teeth     History reviewed. No pertinent family history. History  Substance Use Topics  . Smoking status: Current Every Day Smoker -- 1.00 packs/day    Types: Cigarettes  . Smokeless tobacco: Not on file  . Alcohol Use: Yes     Comment: occ   OB History   Grav Para Term Preterm Abortions TAB SAB Ect Mult Living                 Review of Systems  Constitutional: Positive for fatigue. Negative for fever and chills.  Respiratory: Negative for shortness of breath.   Cardiovascular: Negative for chest pain.  Gastrointestinal: Negative for nausea, vomiting and abdominal pain.   Neurological: Positive for light-headedness.  All other systems reviewed and are negative.    Allergies  Review of patient's allergies indicates no known allergies.  Home Medications   Current Outpatient Rx  Name  Route  Sig  Dispense  Refill  . Biotin ([employer] BIOTIN) 5 MG CAPS   Oral   Take 1 capsule by mouth daily.         . valACYclovir (VALTREX) 500 MG tablet   Oral   Take 500 mg by mouth daily.          . vitamin B-12 (CYANOCOBALAMIN) 1000 MCG tablet   Oral   Take 1,000 mcg by mouth daily.          BP 119/54  Pulse 63  Temp(Src) 98 F (36.7 C) (Oral)  Resp 15  Ht 5\' 3"  (1.6 m)  Wt 200 lb (90.719 kg)  BMI 35.44 kg/m2  SpO2 98%  LMP 07/27/2013 Physical Exam  Nursing note and vitals reviewed. Constitutional: She is oriented to person, place, and time. Vital signs are normal. She appears well-developed and well-nourished. She does not have a sickly appearance. She does not appear ill. No distress.  HENT:  Head: Normocephalic and atraumatic.  Right Ear: External ear normal.  Left Ear: External ear normal.  Nose: Nose normal.  Mouth/Throat: Uvula is midline, oropharynx is clear and moist and mucous membranes are normal.  There is a 1  cm area of contact dermatitis on the posterior scalp. No palpable knot as described by the patient.  Eyes: Conjunctivae, EOM and lids are normal. Pupils are equal, round, and reactive to light.  Neck: Trachea normal, normal range of motion and phonation normal.  No nuchal rigidity or meningeal signs  Cardiovascular: Normal rate, regular rhythm and normal heart sounds.   Pulmonary/Chest: Effort normal and breath sounds normal. No stridor. No respiratory distress. She has no wheezes. She has no rales.  Abdominal: Soft. She exhibits no distension.  Musculoskeletal: Normal range of motion.  Neurological: She is alert and oriented to person, place, and time. She has normal strength. No sensory deficit. Coordination and gait normal.   Finger nose finger wnl  Skin: Skin is warm and dry. She is not diaphoretic. No erythema.  Psychiatric: She has a normal mood and affect. Her behavior is normal.    Date: 08/02/2013  Rate: 57  Rhythm: sinus bradycardia and premature ventricular contractions (PVC)  QRS Axis: normal  Intervals: normal  ST/T Wave abnormalities: normal  Conduction Disutrbances:none  Narrative Interpretation:   Old EKG Reviewed: none available    ED Course  Procedures (including critical care time) Labs Review Labs Reviewed - No data to display Imaging Review No results found.  MDM   1. Fatigue   2. Lightheadedness   3. PVC (premature ventricular contraction)   4. Contact dermatitis    Patient is a very well-appearing 22 year old female. I offered to line and lab her, but patient declined because she did not want to wait. I discussed it was possible her fatigue could be due to an electrolyte abnormality or anemia. She expressed understanding, but states he ride had to be up early in the morning and did not want her to wait. I gave her a resource guide and followup with the [facility] wellness clinic. Encourage followup with PCP. Gave her strict return instructions. Discussed with pt that PVCs were found on her EKG and that this is a benign condition. She can put topical OTC hydrocortisone cream on her contact dermatitis.     Mora Bellman, PA-C 08/03/13 1658

## 2013-08-05 NOTE — ED Provider Notes (Signed)
Medical screening examination/treatment/procedure(s) were performed by non-physician practitioner and as supervising physician I was immediately available for consultation/collaboration.    Celene Kras, MD 08/05/13 [phone]

## 2014-02-17 ENCOUNTER — Emergency Department (HOSPITAL_COMMUNITY)
Admission: EM | Admit: 2014-02-17 | Discharge: 2014-02-17 | Disposition: A | Discharge status: Home / Self Care | Payer: No Typology Code available for payment source | Attending: Emergency Medicine | Admitting: Emergency Medicine

## 2014-02-17 ENCOUNTER — Encounter (HOSPITAL_COMMUNITY): Payer: Self-pay | Admitting: Emergency Medicine

## 2014-02-17 ENCOUNTER — Emergency Department (HOSPITAL_COMMUNITY): Payer: No Typology Code available for payment source

## 2014-02-17 DIAGNOSIS — G8929 Other chronic pain: Secondary | ICD-10-CM | POA: Insufficient documentation

## 2014-02-17 DIAGNOSIS — N925 Other specified irregular menstruation: Secondary | ICD-10-CM | POA: Insufficient documentation

## 2014-02-17 DIAGNOSIS — M545 Low back pain, unspecified: Secondary | ICD-10-CM

## 2014-02-17 DIAGNOSIS — R300 Dysuria: Secondary | ICD-10-CM | POA: Insufficient documentation

## 2014-02-17 DIAGNOSIS — N949 Unspecified condition associated with female genital organs and menstrual cycle: Secondary | ICD-10-CM | POA: Insufficient documentation

## 2014-02-17 DIAGNOSIS — R52 Pain, unspecified: Secondary | ICD-10-CM | POA: Insufficient documentation

## 2014-02-17 DIAGNOSIS — Z8619 Personal history of other infectious and parasitic diseases: Secondary | ICD-10-CM | POA: Insufficient documentation

## 2014-02-17 DIAGNOSIS — F17200 Nicotine dependence, unspecified, uncomplicated: Secondary | ICD-10-CM | POA: Insufficient documentation

## 2014-02-17 DIAGNOSIS — Z8679 Personal history of other diseases of the circulatory system: Secondary | ICD-10-CM | POA: Insufficient documentation

## 2014-02-17 DIAGNOSIS — N938 Other specified abnormal uterine and vaginal bleeding: Secondary | ICD-10-CM | POA: Insufficient documentation

## 2014-02-17 DIAGNOSIS — Z3202 Encounter for pregnancy test, result negative: Secondary | ICD-10-CM | POA: Insufficient documentation

## 2014-02-17 DIAGNOSIS — N939 Abnormal uterine and vaginal bleeding, unspecified: Secondary | ICD-10-CM

## 2014-02-17 DIAGNOSIS — Z79899 Other long term (current) drug therapy: Secondary | ICD-10-CM | POA: Insufficient documentation

## 2014-02-17 HISTORY — DX: Ventricular premature depolarization: I49.3

## 2014-02-17 LAB — I-STAT CHEM 8, ED
BUN: 12 mg/dL (ref 6–23)
CHLORIDE: 105 meq/L (ref 96–112)
Calcium, Ion: 1.17 mmol/L (ref 1.12–1.23)
Creatinine, Ser: 0.9 mg/dL (ref 0.50–1.10)
GLUCOSE: 104 mg/dL — AB (ref 70–99)
HEMATOCRIT: 41 % (ref 36.0–46.0)
Hemoglobin: 13.9 g/dL (ref 12.0–15.0)
POTASSIUM: 3.5 meq/L — AB (ref 3.7–5.3)
SODIUM: 142 meq/L (ref 137–147)
TCO2: 24 mmol/L (ref 0–100)

## 2014-02-17 LAB — URINALYSIS, ROUTINE W REFLEX MICROSCOPIC
BILIRUBIN URINE: NEGATIVE
Glucose, UA: NEGATIVE mg/dL
Ketones, ur: NEGATIVE mg/dL
LEUKOCYTES UA: NEGATIVE
NITRITE: NEGATIVE
PH: 5.5 (ref 5.0–8.0)
Protein, ur: NEGATIVE mg/dL
SPECIFIC GRAVITY, URINE: 1.02 (ref 1.005–1.030)
Urobilinogen, UA: 0.2 mg/dL (ref 0.0–1.0)

## 2014-02-17 LAB — WET PREP, GENITAL
Clue Cells Wet Prep HPF POC: NONE SEEN
Trich, Wet Prep: NONE SEEN
WBC, Wet Prep HPF POC: NONE SEEN
Yeast Wet Prep HPF POC: NONE SEEN

## 2014-02-17 LAB — URINE MICROSCOPIC-ADD ON

## 2014-02-17 LAB — HIV ANTIBODY (ROUTINE TESTING W REFLEX): HIV 1&2 Ab, 4th Generation: NONREACTIVE

## 2014-02-17 LAB — PREGNANCY, URINE: PREG TEST UR: NEGATIVE

## 2014-02-17 MED ORDER — IBUPROFEN 800 MG PO TABS
800.0000 mg | ORAL_TABLET | Freq: Once | ORAL | Status: AC
  Administered 2014-02-17: 800 mg via ORAL
  Filled 2014-02-17: qty 1

## 2014-02-17 MED ORDER — HYDROCODONE-ACETAMINOPHEN 5-325 MG PO TABS
2.0000 | ORAL_TABLET | Freq: Once | ORAL | Status: AC
  Administered 2014-02-17: 2 via ORAL
  Filled 2014-02-17: qty 2

## 2014-02-17 MED ORDER — IBUPROFEN 800 MG PO TABS: 800.0000 mg | ORAL_TABLET | Freq: Three times a day (TID) | ORAL | Status: DC | PRN

## 2014-02-17 MED ORDER — CYCLOBENZAPRINE HCL 10 MG PO TABS: 10.0000 mg | ORAL_TABLET | Freq: Two times a day (BID) | ORAL | Status: DC | PRN

## 2014-02-17 NOTE — ED Notes (Signed)
Bed: WA25 Expected date:  Expected time:  Means of arrival:  Comments: EMS-flank pain 

## 2014-02-17 NOTE — ED Notes (Signed)
Pt encouraged to void and states will attempt post lab draw.

## 2014-02-17 NOTE — Discharge Instructions (Signed)
Read the information below.  Use the prescribed medication as directed.  Please discuss all new medications with your pharmacist.  You may return to the Emergency Department at any time for worsening condition or any new symptoms that concern you.   If you develop fevers, loss of control of bowel or bladder, weakness or numbness in your legs, or are unable to walk, return to the ER for a recheck.  ° ° °Back Exercises °Back exercises help treat and prevent back injuries. The goal of back exercises is to increase the strength of your abdominal and back muscles and the flexibility of your back. These exercises should be started when you no longer have back pain. Back exercises include: °· Pelvic Tilt. Lie on your back with your knees bent. Tilt your pelvis until the lower part of your back is against the floor. Hold this position 5 to 10 sec and repeat 5 to 10 times. °· Knee to Chest. Pull first 1 knee up against your chest and hold for 20 to 30 seconds, repeat this with the other knee, and then both knees. This may be done with the other leg straight or bent, whichever feels better. °· Sit-Ups or Curl-Ups. Bend your knees 90 degrees. Start with tilting your pelvis, and do a partial, slow sit-up, lifting your trunk only 30 to 45 degrees off the floor. Take at least 2 to 3 seconds for each sit-up. Do not do sit-ups with your knees out straight. If partial sit-ups are difficult, simply do the above but with only tightening your abdominal muscles and holding it as directed. °· Hip-Lift. Lie on your back with your knees flexed 90 degrees. Push down with your feet and shoulders as you raise your hips a couple inches off the floor; hold for 10 seconds, repeat 5 to 10 times. °· Back arches. Lie on your stomach, propping yourself up on bent elbows. Slowly press on your hands, causing an arch in your low back. Repeat 3 to 5 times. Any initial stiffness and discomfort should lessen with repetition over time. °· Shoulder-Lifts.  Lie face down with arms beside your body. Keep hips and torso pressed to floor as you slowly lift your head and shoulders off the floor. °Do not overdo your exercises, especially in the beginning. Exercises may cause you some mild back discomfort which lasts for a few minutes; however, if the pain is more severe, or lasts for more than 15 minutes, do not continue exercises until you see your caregiver. Improvement with exercise therapy for back problems is slow.  °See your caregivers for assistance with developing a proper back exercise program. °Document Released: 12/04/2004 Document Revised: 01/19/2012 Document Reviewed: 08/28/2011 °ExitCare® Patient Information ©2014 [employer]. ° °Back Pain, Adult °Low back pain is very common. About 1 in 5 people have back pain. The cause of low back pain is rarely dangerous. The pain often gets better over time. About half of people with a sudden onset of back pain feel better in just 2 weeks. About 8 in 10 people feel better by 6 weeks.  °CAUSES °Some common causes of back pain include: °· Strain of the muscles or ligaments supporting the spine. °· Wear and tear (degeneration) of the spinal discs. °· Arthritis. °· Direct injury to the back. °DIAGNOSIS °Most of the time, the direct cause of low back pain is not known. However, back pain can be treated effectively even when the exact cause of the pain is unknown. Answering your caregiver's questions about your overall health and   symptoms is one of the most accurate ways to make sure the cause of your pain is not dangerous. If your caregiver needs more information, he or she may order lab work or imaging tests (X-rays or MRIs). However, even if imaging tests show changes in your back, this usually does not require surgery. °HOME CARE INSTRUCTIONS °For many people, back pain returns. Since low back pain is rarely dangerous, it is often a condition that people can learn to manage on their own.  °· Remain active. It is stressful  on the back to sit or stand in one place. Do not sit, drive, or stand in one place for more than 30 minutes at a time. Take short walks on level surfaces as soon as pain allows. Try to increase the length of time you walk each day. °· Do not stay in bed. Resting more than 1 or 2 days can delay your recovery. °· Do not avoid exercise or work. Your body is made to move. It is not dangerous to be active, even though your back may hurt. Your back will likely heal faster if you return to being active before your pain is gone. °· Pay attention to your body when you  bend and lift. Many people have less discomfort when lifting if they bend their knees, keep the load close to their bodies, and avoid twisting. Often, the most comfortable positions are those that put less stress on your recovering back. °· Find a comfortable position to sleep. Use a firm mattress and lie on your side with your knees slightly bent. If you lie on your back, put a pillow under your knees. °· Only take over-the-counter or prescription medicines as directed by your caregiver. Over-the-counter medicines to reduce pain and inflammation are often the most helpful. Your caregiver may prescribe muscle relaxant drugs. These medicines help dull your pain so you can more quickly return to your normal activities and healthy exercise. °· Put ice on the injured area. °· Put ice in a plastic bag. °· Place a towel between your skin and the bag. °· Leave the ice on for 15-20 minutes, 03-04 times a day for the first 2 to 3 days. After that, ice and heat may be alternated to reduce pain and spasms. °· Ask your caregiver about trying back exercises and gentle massage. This may be of some benefit. °· Avoid feeling anxious or stressed. Stress increases muscle tension and can worsen back pain. It is important to recognize when you are anxious or stressed and learn ways to manage it. Exercise is a great option. °SEEK MEDICAL CARE IF: °· You have pain that is not  relieved with rest or medicine. °· You have pain that does not improve in 1 week. °· You have new symptoms. °· You are generally not feeling well. °SEEK IMMEDIATE MEDICAL CARE IF:  °· You have pain that radiates from your back into your legs. °· You develop new bowel or bladder control problems. °· You have unusual weakness or numbness in your arms or legs. °· You develop nausea or vomiting. °· You develop abdominal pain. °· You feel faint. °Document Released: 10/27/2005 Document Revised: 04/27/2012 Document Reviewed: 03/17/2011 °ExitCare® Patient Information ©2014 [employer]. ° ° ° °Emergency Department Resource Guide °1) Find a Doctor and Pay Out of Pocket °Although you won't have to find out who is covered by your insurance plan, it is a good idea to ask around and get recommendations. You will then need to call the office and see if the doctor you have chosen will accept you as a new patient   and what types of options they offer for patients who are self-pay. Some doctors offer discounts or will set up payment plans for their patients who do not have insurance, but you will need to ask so you aren't surprised when you get to your appointment. ° °2) Contact Your Local Health Department °Not all health departments have doctors that can see patients for sick visits, but many do, so it is worth a call to see if yours does. If you don't know where your local health department is, you can check in your phone book. The CDC also has a tool to help you locate your state's health department, and many state websites also have listings of all of their local health departments. ° °3) Find a Walk-in Clinic °If your illness is not likely to be very severe or complicated, you may want to try a walk in clinic. These are popping up all over the country in pharmacies, drugstores, and shopping centers. They're usually staffed by nurse practitioners or physician assistants that have been trained to treat common illnesses and  complaints. They're usually fairly quick and inexpensive. However, if you have serious medical issues or chronic medical problems, these are probably not your best option. ° °No Primary Care Doctor: °- Call [facility] at  832-8000 - they can help you locate a primary care doctor that  accepts your insurance, provides certain services, etc. °- Physician Referral Service- 1-800-533-3463 ° °Chronic Pain Problems: °Organization         Address  Phone   Notes  °[facility]  (336) 297-2271 Patients need to be referred by their primary care doctor.  ° °Medication Assistance: °Organization         Address  Phone   Notes  °[place] [facility] [address] °[place], [place] [address] (336) 641-8030 --Must be a resident of [place] °-- Must have NO insurance coverage whatsoever (no Medicaid/ Medicare, etc.) °-- The pt. MUST have a primary care doctor that directs their care regularly and follows them in the community °  °[facility]  (866) 331-1348   °[employer]  (888) 892-1162   ° °Agencies that provide inexpensive medical care: °Organization         Address  Phone   Notes  °[facility]  (336) 832-8035   °[facility]    (336) 832-7272   °[facility] [address] °[place], [place] [address] (336) 832-4777   °[facility] [address], °[place] (336) 271-4999   °[facility]    (336) 373-0678   °[facility]    (336) 272-1050   °[facility] ° [address], [place] Phone:  (336) 832-4444, Fax:  (336) 832-4440 Hours of Operation:  9 am - 6 pm, M-F.  Also accepts Medicaid/Medicare and self-pay.  °[facility] ° [address],  Phone: (336) 832-3150, Fax: (336) [phone]. Hours of Operation:  8:30 am - 5:30 pm, M-F.  Also accepts Medicaid and self-pay.  °[facility] [place] [address], [place] Phone: (336) 878-6027   °[facility] [address], [place], [place] (336)723-1848, Ext. 123 Mondays & Thursdays: 7-9 AM.  First 15 patients are seen on a first come, first serve basis. °  ° °Medicaid-accepting [place] Providers: ° °Organization         Address  Phone     Notes  °[facility] [address], [place] (336) 641-2100 Also accepts self-pay patients.  °[facility] [address], [place] ° (336) 856-9996   °[facility] [address], [place] (336) 288-8857   °[facility] [address] [place] [address], [place] (336) 299-7000   °Veita Bland [address], [place]  ° (336) 373-1557 Only accepts [place] Access Medicaid patients after they have their name applied to their card.  ° °Self-Pay (no insurance) in [place]: ° °Organization         Address  Phone   Notes  °Sickle Cell Patients, [facility] [address], [place] (336) 832-1970   °[facility] [address], [place] (336) 832-4400   °[facility] ° [address], [place] (336) 992-4800   °[facility]/Dr. Osei-Bonsu ° [address] [place] [address], [place] or [address], [place] (336) [phone] Phone number for both [place] and [place] locations is the same.  °[facility] [address], [place] (336) 299-0000   °[facility] [address] [place] [address], [place] or [address] (336) 852-7530 °(336) 878-2260   °[facility] [address], [place] (336) 350-1642, phone; (336) 294-5005, fax Sees patients 1st and 3rd Saturday of every month.  Must not qualify for public or private insurance (i.e. Medicaid, Medicare, [place] Health Choice, Veterans' Benefits) • Household income should be no more than 200% of the poverty level  •The clinic cannot treat you if you are pregnant or think you are pregnant • Sexually transmitted diseases are not treated at the clinic.  ° ° °Dental Care: °Organization         Address  Phone  Notes  °[facility] [address],  (336) 641-6152 Accepts children up to age 21 who are enrolled in Medicaid or [place] Health Choice; pregnant women with a Medicaid card; and children who have applied for Medicaid or [place] Health Choice, but were declined, whose parents can pay a reduced fee at time of service.  °[facility] [place]  [address], [place] (336) 641-7733 Accepts children up to age 21 who are enrolled in Medicaid or [place] Health Choice; pregnant women with a Medicaid card; and children who have applied for Medicaid or [place] Health Choice, but were declined, whose parents can pay a reduced fee at time of service.  °[facility] ° [address],  (336) 641-4533 Patients are seen by appointment only. Walk-ins are not accepted. [facility] will see patients 18 years of age and older. °Monday - Tuesday (8am-5pm) °Most Wednesdays (8:30-5pm) °$30 per visit, cash only  °[facility] ° [address], [place] (336) 641-4533 Patients are seen by appointment only. Walk-ins are not accepted. [facility] will see patients 18 years of age and older. °One Wednesday Evening (Monthly: Volunteer Based).  $30 per visit, cash only  °[facility]  (919) 537-3737 for adults; Children under age 4, call Graduate Pediatric Dentistry at ([phone]) [phone]. Children aged 4-14, please call (919) [phone] to request a pediatric application. ° Dental services are provided in all areas of dental care including fillings, crowns and bridges, complete and   partial dentures, implants, gum treatment, root canals, and extractions. Preventive care is  also provided. Treatment is provided to both adults and children. °Patients are selected via a lottery and there is often a waiting list. °  °[facility] [address], °[place] ° (336) 763-8833 [email or web address] °  °[facility] [address], [place], [place] (336)723-1848, Ext. 123 Second and Fourth Thursday of each month, opens at 6:30 AM; Clinic ends at 9 AM.  Patients are seen on a first-come first-served basis, and a limited number are seen during each clinic.  ° °[facility] ° [address], [place], [place] (336) 723-7904   Eligibility Requirements °You must have lived in [place], [place], or [place] counties for at least the last three months. °  You cannot be eligible for state or federal sponsored healthcare insurance, including Veterans Administration, Medicaid, or Medicare. °  You generally cannot be eligible for healthcare insurance through your employer.  °  How to apply: °Eligibility screenings are held every Tuesday and Wednesday afternoon from 1:00 pm until 4:00 pm. You do not need an appointment for the interview!  °[facility] [address], [place], [place] 336-631-2330   °[facility]  336-342-8273   °[facility]  336-703-3100   °[facility]  336-570-6415   ° °Behavioral Health Resources in the Community: °Intensive Outpatient Programs °Organization         Address  Phone  Notes  °[facility] [address], [place], [place] 336-878-6098   °[facility] [address], [place], [place] 336-832-9800   °[facility] [address], [place], [place] ° 336-882-2125   °[facility] [address],  °[place], [place] 1-800-853-5163 or 336-641-4981   °Substance Abuse Resources °Organization         Address  Phone  Notes  °[facility]  336-882-2125   °[facility]  336-784-9470   °[facility]  336-285-9073   °[facility]  336-845-3988   °Residential & Outpatient Substance Abuse Program  1-800-659-3381   °Psychological Services °Organization         Address  Phone  Notes  °[facility]  336- 832-9600   °[facility]  336- 378-7881   °[facility] [address], [place] 1-800-853-5163 or 336-641-4981   ° °Mobile Crisis Teams °Organization         Address  Phone  Notes  °[facility], Mobile Crisis Care Unit  1-877-626-1772   °Assertive °[facility] ° [address] [place], [place] 336-834-9664   °Sharon DeEsch [address] °[place] 336-554-5454   ° °Self-Help/Support Groups °Organization         Address  Phone             Notes  °[facility] - variety of support groups  336- 373-1402 Call for more information  °Narcotics Anonymous (NA), [facility] [address], °[place]  2 meetings at this location  ° °Residential Treatment Programs °Organization         Address  Phone  Notes  °[facility] [address],    °[place]  1-866-801-8205   °[facility] ° [address], [place],  704-293-8524   °[facility] [address], [place] 336-845-3988 Admissions: 8am-3pm M-F  °  [facility] [address]    °[place], [place] 336-841-1104   °[facility] [address], [place], [place] 336-379-7146   °[facility] [address]  °[place], [place] 336-285-9073   °[facility] Intensive Outpatient [address], [place], [place] 336-852-3033   °[facility] [address]  °[place], [place] 1-877-615-2722 or 336-784-9470   °[facility] [address] [place], [place] 336-227-7417 Accepts Medicaid  °[facility] [address]  °[place] 1-800-659-3381  Substance Abuse/Addiction Treatment  ° °[place] Behavioral Health Resources °Organization         Address  Phone  Notes  °[facility]  (888) 581-9988   °Julie Brannon, PhD [address] [place], [place]   (336) 349-5553 or (336) 951-0000   °[facility]   [address] °[place], [place] (336) 349-4454   °[facility] [address], [place], [place] (336) 342-8316 Insurance/Medicaid/sponsorship through [facility]  °[facility] [address]                                    [place], [place] (336) 342-8316 Therapy/tele-psych/case  °[facility] [address]  ° [place],  (336) 349-2233    °Dr. Arfeen  (336) 349-4544   °[facility] [place]  [employer] [place] [facility] 1) [address],  °2) [address], [place] °3)  [address], [place] (336) 349-3220 °(336) 342-7768 ° °(336) 342-8140   °[place] [facility] (336) 342-1394 or (336) 342-3537 (After Hours)    ° ° ° °

## 2014-02-17 NOTE — ED Notes (Addendum)
Per EMS pt has had right sided flank pain that radiates to groin and leg since yesterday. Pt reports having heavy period for 4 weeks. Pt reports 3 negative pregnancy test in past month but possibility of being pregnant.

## 2014-02-17 NOTE — ED Notes (Signed)
Chem 8 drawn by Frazier Buttachel Covil NT.

## 2014-02-17 NOTE — ED Provider Notes (Signed)
CSN: [id number]     Arrival date & time 02/17/14  [id number] History   First MD Initiated Contact with Patient 02/17/14 [phone]     Chief Complaint  Patient presents with  . Flank Pain     (Consider location/radiation/quality/duration/timing/severity/associated sxs/prior Treatment) The history is provided by the patient.    Patient with chronic low back pain presents with back pain worse than her usual back pain. Pain is a middle of her lower back and radiates down both of her legs. States the pain is worse and her right leg than her left. Pain is worse with movement and walking. It is sharp. It is not improved with heating pad.  She also had increased pain with urination this morning.  Has had abnormal vaginal bleeding for the past 4 weeks. Prior to that her last period was in January. She is using 5-8 tampons per day. She feels generalized weakness but no lightheadedness, dizziness.  Denies fevers, chills, abdominal pain, loss of control of bowel or bladder, weakness of numbness of the extremities, saddle anesthesia, bowel, urinary, or abnormal vaginal discharge beyond bleeding.  Denies hx CA, IVDU.   Denies any injury. Picks up 23 year old sister regularly, last yesterday.     Past Medical History  Diagnosis Date  . Herpes   . PVC (premature ventricular contraction)    Past Surgical History  Procedure Laterality Date  . Extraction of wisdom teeth     No family history on file. History  Substance Use Topics  . Smoking status: Current Every Day Smoker -- 1.00 packs/day    Types: Cigarettes  . Smokeless tobacco: Not on file  . Alcohol Use: Yes     Comment: occasionally   OB History   Grav Para Term Preterm Abortions TAB SAB Ect Mult Living                 Review of Systems  Constitutional: Negative for fever.  Respiratory: Negative for cough and shortness of breath.   Cardiovascular: Negative for chest pain.  Gastrointestinal: Negative for nausea, vomiting, abdominal pain and  diarrhea.  Genitourinary: Positive for dysuria, vaginal bleeding and menstrual problem. Negative for urgency, frequency and vaginal discharge.  Musculoskeletal: Positive for back pain.  Neurological: Negative for weakness and numbness.  All other systems reviewed and are negative.     Allergies  Review of patient's allergies indicates no known allergies.  Home Medications   Current Outpatient Rx  Name  Route  Sig  Dispense  Refill  . Biotin ([employer] BIOTIN) 5 MG CAPS   Oral   Take 1 capsule by mouth daily.         . valACYclovir (VALTREX) 500 MG tablet   Oral   Take 500 mg by mouth daily.          . vitamin B-12 (CYANOCOBALAMIN) 1000 MCG tablet   Oral   Take 1,000 mcg by mouth daily.          BP 111/58  Pulse 69  Temp(Src) 98.1 F (36.7 C) (Oral)  Resp 16  SpO2 98%  LMP 02/17/2014 Physical Exam  Nursing note and vitals reviewed. Constitutional: She appears well-developed and well-nourished. No distress.  HENT:  Head: Normocephalic and atraumatic.  Neck: Neck supple.  Cardiovascular: Normal rate and regular rhythm.   Pulmonary/Chest: Effort normal and breath sounds normal. No respiratory distress. She has no wheezes. She has no rales.  Abdominal: Soft. She exhibits no distension. There is no tenderness. There is no rebound  and no guarding.  obese  Genitourinary: Uterus is not tender. Cervix exhibits no motion tenderness. Right adnexum displays no tenderness. Left adnexum displays no tenderness. There is bleeding around the vagina. No erythema or tenderness around the vagina. No foreign body around the vagina.  Bimanual exam somewhat limited secondary to body habitus.  Small to moderate amount of dark blood within vagina.   Musculoskeletal:       Arms: Lower extremities:  Strength 5/5, sensation intact, distal pulses intact.     Neurological: She is alert.  Skin: She is not diaphoretic.    ED Course  Procedures (including critical care time) Labs Review Labs  Reviewed  URINALYSIS, ROUTINE W REFLEX MICROSCOPIC - Abnormal; Notable for the following:    APPearance CLOUDY (*)    Hgb urine dipstick MODERATE (*)    All other components within normal limits  I-STAT CHEM 8, ED - Abnormal; Notable for the following:    Potassium 3.5 (*)    Glucose, Bld 104 (*)    All other components within normal limits  WET PREP, GENITAL  GC/CHLAMYDIA PROBE AMP  PREGNANCY, URINE  URINE MICROSCOPIC-ADD ON  HIV ANTIBODY (ROUTINE TESTING)   Imaging Review Dg Lumbar Spine Complete  02/17/2014   CLINICAL DATA:  Low back pain  EXAM: LUMBAR SPINE - COMPLETE 4+ VIEW  COMPARISON:  None.  FINDINGS: There is no evidence of lumbar spine fracture. Alignment is normal. Intervertebral disc spaces are maintained.  IMPRESSION: No acute abnormality noted.   Electronically Signed   By: Alcide Clever M.D.   On: 02/17/2014 12:51     EKG Interpretation None      MDM   Final diagnoses:  Low back pain  Abnormal vaginal bleeding    Pt with chronic low back pain, picked up her 55 year old sister yesterday (previously denied heavy lifting until after results returned).  UA, pelvic exam, labs unremarkable.  Xray negative.  Pt d/c home with ibuprofen, flexeril.  Pt also having abnormal vaginal bleeding.  She is not anemic and only has small amount of blood on exam.  Advised gyn follow up.  Discussed results, findings, treatment, and follow up  with patient.  Pt given return precautions.  Pt verbalizes understanding and agrees with plan.        Trixie Dredge, PA-C 02/17/14 1514

## 2014-02-17 NOTE — Progress Notes (Signed)
[facility] provided pt with a list of primary care resources to help patient establish primary care.  °

## 2014-02-18 LAB — GC/CHLAMYDIA PROBE AMP
CT PROBE, AMP APTIMA: NEGATIVE
GC PROBE AMP APTIMA: NEGATIVE

## 2014-02-20 NOTE — ED Provider Notes (Signed)
Medical screening examination/treatment/procedure(s) were performed by non-physician practitioner and as supervising physician I was immediately available for consultation/collaboration.   EKG Interpretation None        Daniela Siebers T [place] Perra, MD 02/20/14 1717 

## 2014-06-22 ENCOUNTER — Encounter (HOSPITAL_COMMUNITY): Payer: Self-pay | Admitting: Emergency Medicine

## 2014-06-22 ENCOUNTER — Emergency Department (HOSPITAL_COMMUNITY)
Admission: EM | Admit: 2014-06-22 | Discharge: 2014-06-22 | Disposition: A | Discharge status: Home / Self Care | Payer: No Typology Code available for payment source | Attending: Emergency Medicine | Admitting: Emergency Medicine

## 2014-06-22 ENCOUNTER — Emergency Department (HOSPITAL_COMMUNITY): Payer: No Typology Code available for payment source

## 2014-06-22 DIAGNOSIS — S91309A Unspecified open wound, unspecified foot, initial encounter: Secondary | ICD-10-CM | POA: Diagnosis not present

## 2014-06-22 DIAGNOSIS — S91332A Puncture wound without foreign body, left foot, initial encounter: Secondary | ICD-10-CM

## 2014-06-22 DIAGNOSIS — Z8679 Personal history of other diseases of the circulatory system: Secondary | ICD-10-CM | POA: Insufficient documentation

## 2014-06-22 DIAGNOSIS — Z23 Encounter for immunization: Secondary | ICD-10-CM | POA: Insufficient documentation

## 2014-06-22 DIAGNOSIS — Y9389 Activity, other specified: Secondary | ICD-10-CM | POA: Insufficient documentation

## 2014-06-22 DIAGNOSIS — F17200 Nicotine dependence, unspecified, uncomplicated: Secondary | ICD-10-CM | POA: Diagnosis not present

## 2014-06-22 DIAGNOSIS — Z79899 Other long term (current) drug therapy: Secondary | ICD-10-CM | POA: Diagnosis not present

## 2014-06-22 DIAGNOSIS — W268XXA Contact with other sharp object(s), not elsewhere classified, initial encounter: Secondary | ICD-10-CM | POA: Diagnosis not present

## 2014-06-22 DIAGNOSIS — Y929 Unspecified place or not applicable: Secondary | ICD-10-CM | POA: Insufficient documentation

## 2014-06-22 MED ORDER — CEPHALEXIN 500 MG PO CAPS: 500.0000 mg | ORAL_CAPSULE | Freq: Four times a day (QID) | ORAL | Status: DC

## 2014-06-22 MED ORDER — TETANUS-DIPHTH-ACELL PERTUSSIS 5-2.5-18.5 LF-MCG/0.5 IM SUSP
0.5000 mL | Freq: Once | INTRAMUSCULAR | Status: AC
  Administered 2014-06-22: 0.5 mL via INTRAMUSCULAR
  Filled 2014-06-22: qty 0.5

## 2014-06-22 NOTE — ED Provider Notes (Signed)
Medical screening examination/treatment/procedure(s) were performed by non-physician practitioner and as supervising physician I was immediately available for consultation/collaboration.   EKG Interpretation None       Phillippa Straub, MD 06/22/14 2337 

## 2014-06-22 NOTE — Discharge Instructions (Signed)
Puncture Wound °A puncture wound is an injury that extends through all layers of the skin and into the tissue beneath the skin (subcutaneous tissue). Puncture wounds become infected easily because germs often enter the body and go beneath the skin during the injury. Having a deep wound with a small entrance point makes it difficult for your caregiver to adequately clean the wound. This is especially true if you have stepped on a nail and it has passed through a dirty shoe or other situations where the wound is obviously contaminated. °CAUSES  °Many puncture wounds involve glass, nails, splinters, fish hooks, or other objects that enter the skin (foreign bodies). A puncture wound may also be caused by a human bite or animal bite. °DIAGNOSIS  °A puncture wound is usually diagnosed by your history and a physical exam. You may need to have an X-ray or an ultrasound to check for any foreign bodies still in the wound. °TREATMENT  °· Your caregiver will clean the wound as thoroughly as possible. Depending on the location of the wound, a bandage (dressing) may be applied. °· Your caregiver might prescribe antibiotic medicines. °· You may need a follow-up visit to check on your wound. Follow all instructions as directed by your caregiver. °HOME CARE INSTRUCTIONS  °· Change your dressing once per day, or as directed by your caregiver. If the dressing sticks, it may be removed by soaking the area in water. °· If your caregiver has given you follow-up instructions, it is very important that you return for a follow-up appointment. Not following up as directed could result in a chronic or permanent injury, pain, and disability. °· Only take over-the-counter or prescription medicines for pain, discomfort, or fever as directed by your caregiver. °· If you are given antibiotics, take them as directed. Finish them even if you start to feel better. °You may need a tetanus shot if: °· You cannot remember when you had your last tetanus  shot. °· You have never had a tetanus shot. °If you got a tetanus shot, your arm may swell, get red, and feel warm to the touch. This is common and not a problem. If you need a tetanus shot and you choose not to have one, there is a rare chance of getting tetanus. Sickness from tetanus can be serious. °You may need a rabies shot if an animal bite caused your puncture wound. °SEEK MEDICAL CARE IF:  °· You have redness, swelling, or increasing pain in the wound. °· You have red streaks going away from the wound. °· You notice a bad smell coming from the wound or dressing. °· You have yellowish-white fluid (pus) coming from the wound. °· You are treated with an antibiotic for infection, but the infection is not getting better. °· You notice something in the wound, such as rubber from your shoe, cloth, or another object. °· You have a fever. °· You have severe pain. °· You have difficulty breathing. °· You feel dizzy or faint. °· You cannot stop vomiting. °· You lose feeling, develop numbness, or cannot move a limb below the wound. °· Your symptoms worsen. °MAKE SURE YOU: °· Understand these instructions. °· Will watch your condition. °· Will get help right away if you are not doing well or get worse. °Document Released: 08/06/2005 Document Revised: 01/19/2012 Document Reviewed: 04/15/2011 °[employer]® Patient Information ©2015 [employer]. This information is not intended to replace advice given to you by your health care provider. Make sure you discuss any questions you   have with your health care provider. ° °

## 2014-06-22 NOTE — ED Notes (Signed)
Pt reports she stepped on a nail this evening - no bleeding noted. Pt reports unknown last dose of tetanus vaccination.

## 2014-06-22 NOTE — ED Provider Notes (Signed)
CSN: [id number]     Arrival date & time 06/22/14  0104 History   First MD Initiated Contact with Patient 06/22/14 0350     Chief Complaint  Patient presents with  . Puncture Wound    (Consider location/radiation/quality/duration/timing/severity/associated sxs/prior Treatment) Patient is a 23 y.o. female presenting with foot injury. The history is provided by the patient. No language interpreter was used.  Foot Injury Location:  Foot Time since incident:  10 hours Injury: yes   Mechanism of injury comment:  Puncture wound by nail Foot location:  L foot Pain details:    Quality:  Aching and throbbing   Radiates to:  Does not radiate   Severity:  Mild   Onset quality:  Sudden   Duration:  10 hours   Timing:  Constant   Progression:  Unchanged Chronicity:  New Dislocation: no   Foreign body present:  No foreign bodies Tetanus status:  Unknown Prior injury to area:  No Relieved by:  None tried Worsened by:  Bearing weight Ineffective treatments:  None tried Associated symptoms: swelling (mild)   Associated symptoms: no decreased ROM, no fever, no numbness and no tingling   Risk factors: no known bone disorder     Past Medical History  Diagnosis Date  . Herpes   . PVC (premature ventricular contraction)    Past Surgical History  Procedure Laterality Date  . Extraction of wisdom teeth     No family history on file. History  Substance Use Topics  . Smoking status: Current Every Day Smoker -- 1.00 packs/day    Types: Cigarettes  . Smokeless tobacco: Not on file  . Alcohol Use: Yes     Comment: occasionally   OB History   Grav Para Term Preterm Abortions TAB SAB Ect Mult Living                  Review of Systems  Constitutional: Negative for fever.  Musculoskeletal: Positive for myalgias.  Skin: Positive for wound.  All other systems reviewed and are negative.    Allergies  Review of patient's allergies indicates no known allergies.  Home Medications    Prior to Admission medications   Medication Sig Start Date End Date Taking? Authorizing Provider  Biotin ([employer] BIOTIN) 5 MG CAPS Take 1 capsule by mouth daily.   Yes Historical Provider, MD  Ginkgo Biloba (GINKGO PO) Take 1 capsule by mouth daily.   Yes Historical Provider, MD  valACYclovir (VALTREX) 500 MG tablet Take 500 mg by mouth daily.    Yes Historical Provider, MD  cephALEXin (KEFLEX) 500 MG capsule Take 1 capsule (500 mg total) by mouth 4 (four) times daily. 06/22/14   Antony Madura, PA-C   BP 125/61  Pulse 62  Temp(Src) 98.4 F (36.9 C) (Oral)  Resp 16  SpO2 96%  LMP 05/29/2014  Physical Exam  Nursing note and vitals reviewed. Constitutional: She is oriented to person, place, and time. She appears well-developed and well-nourished. No distress.  Nontoxic/nonseptic appearing  HENT:  Head: Normocephalic and atraumatic.  Eyes: Conjunctivae and EOM are normal. No scleral icterus.  Neck: Normal range of motion.  Cardiovascular: Normal rate, regular rhythm and intact distal pulses.   DP and PT pulses 2+ in left lower extremity.  Pulmonary/Chest: Effort normal. No respiratory distress.  Musculoskeletal: Normal range of motion. She exhibits tenderness.  Normal range of motion of left foot. Puncture site appreciated to plantar aspect of distal lateral left foot. No active bleeding. No associated erythema, purulent  drainage, or heat to touch.  Neurological: She is alert and oriented to person, place, and time. She exhibits normal muscle tone. Coordination normal.  No gross sensory deficits appreciated. Patient able to wiggle all toes.  Skin: Skin is warm and dry. No rash noted. She is not diaphoretic. No erythema. No pallor.  Psychiatric: She has a normal mood and affect. Her behavior is normal.    ED Course  Procedures (including critical care time) Labs Review Labs Reviewed - No data to display  Imaging Review Dg Foot Complete Left  06/22/2014   CLINICAL DATA:  Stepped on  nail, with puncture wound at the left lateral foot.  EXAM: LEFT FOOT - COMPLETE 3+ VIEW  COMPARISON:  None.  FINDINGS: There is no evidence of fracture or dislocation. The joint spaces are preserved. There is no evidence of talar subluxation; the subtalar joint is unremarkable in appearance.  The known puncture wound is not well characterized on radiograph. No radiopaque foreign bodies are seen.  IMPRESSION: No evidence of fracture or dislocation. No radiopaque foreign bodies seen.   Electronically Signed   By: Roanna RaiderJeffery  Chang M.D.   On: 06/22/2014 02:16     EKG Interpretation None      MDM   Final diagnoses:  Puncture wound of foot, left, initial encounter    23 year old female presents to the emergency department after stepping on a rusty nail at 7 PM yesterday. Patient neurovascularly intact. No gross sensory deficits appreciated. Tetanus updated in ED as last tetanus unknown. Imaging today shows no fracture, dislocation, or foreign bodies. Given dirty nature of wound, will place patient on short course of Keflex to cover for infection. Wound care instructions provided. Return precautions discussed and patient agreeable to plan with no unaddressed concerns. Patient discharged in good condition.   Filed Vitals:   06/22/14 0141 06/22/14 0502  BP: 114/55 125/61  Pulse: 90 62  Temp: 98.4 F (36.9 C)   TempSrc: Oral   Resp: 16 16  SpO2: 96%       Antony MaduraKelly Channon Ambrosini, PA-C 06/22/14 [phone]

## 2014-06-22 NOTE — ED Notes (Signed)
Pt ambulating independently w/ steady gait on d/c in no acute distress, A&Ox4. D/c instructions reviewed w/ pt and family - pt and family deny any further questions or concerns at present. Rx given x1  

## 2014-06-22 NOTE — ED Notes (Signed)
Pt states that she stepped on a nail around 7pm; pt with band-aid to her foot; puncture wound noted to left foot;no bleeding at present; pt c/o discomfort upon ambulation

## 2014-11-05 ENCOUNTER — Emergency Department (HOSPITAL_COMMUNITY)
Admission: EM | Admit: 2014-11-05 | Discharge: 2014-11-06 | Disposition: A | Discharge status: Home / Self Care | Payer: No Typology Code available for payment source | Attending: Emergency Medicine | Admitting: Emergency Medicine

## 2014-11-05 ENCOUNTER — Encounter (HOSPITAL_COMMUNITY): Payer: Self-pay | Admitting: Emergency Medicine

## 2014-11-05 DIAGNOSIS — Z79899 Other long term (current) drug therapy: Secondary | ICD-10-CM | POA: Insufficient documentation

## 2014-11-05 DIAGNOSIS — Y998 Other external cause status: Secondary | ICD-10-CM | POA: Insufficient documentation

## 2014-11-05 DIAGNOSIS — B009 Herpesviral infection, unspecified: Secondary | ICD-10-CM | POA: Insufficient documentation

## 2014-11-05 DIAGNOSIS — Y9389 Activity, other specified: Secondary | ICD-10-CM | POA: Insufficient documentation

## 2014-11-05 DIAGNOSIS — Z8679 Personal history of other diseases of the circulatory system: Secondary | ICD-10-CM | POA: Insufficient documentation

## 2014-11-05 DIAGNOSIS — Y9289 Other specified places as the place of occurrence of the external cause: Secondary | ICD-10-CM | POA: Insufficient documentation

## 2014-11-05 DIAGNOSIS — W540XXA Bitten by dog, initial encounter: Secondary | ICD-10-CM | POA: Insufficient documentation

## 2014-11-05 DIAGNOSIS — S71151A Open bite, right thigh, initial encounter: Secondary | ICD-10-CM | POA: Insufficient documentation

## 2014-11-05 DIAGNOSIS — Z720 Tobacco use: Secondary | ICD-10-CM | POA: Insufficient documentation

## 2014-11-05 MED ORDER — AMOXICILLIN-POT CLAVULANATE 875-125 MG PO TABS: 1.0000 | ORAL_TABLET | Freq: Two times a day (BID) | ORAL | Status: DC

## 2014-11-05 MED ORDER — RABIES IMMUNE GLOBULIN 150 UNIT/ML IM INJ
2100.0000 [IU] | INJECTION | Freq: Once | INTRAMUSCULAR | Status: AC
  Administered 2014-11-05: 2100 [IU] via INTRAMUSCULAR
  Filled 2014-11-05: qty 14

## 2014-11-05 MED ORDER — RABIES VACCINE, PCEC IM SUSR
1.0000 mL | Freq: Once | INTRAMUSCULAR | Status: AC
  Administered 2014-11-05: 1 mL via INTRAMUSCULAR
  Filled 2014-11-05: qty 1

## 2014-11-05 MED ORDER — AMOXICILLIN-POT CLAVULANATE 875-125 MG PO TABS
1.0000 | ORAL_TABLET | Freq: Once | ORAL | Status: AC
  Administered 2014-11-05: 1 via ORAL
  Filled 2014-11-05: qty 1

## 2014-11-05 NOTE — ED Provider Notes (Signed)
CSN: [id number]     Arrival date & time 11/05/14  1841 History  This chart was scribed for non-physician practitioner working with No att. providers found by Elveria Risingimelie Horne, ED Scribe. This patient was seen in room TR09C/TR09C and the patient's care was started at 10:21 PM.   Chief Complaint  Patient presents with  . Animal Bite    The patient said she was at a bar and was bitten by a "service dog".     The history is provided by the patient. No language interpreter was used.   HPI Comments: Tiffany KocherKari R Christian is a 23 y.o. female with PMHx of Herpes and PVC who presents to the Emergency Department with animal bite sustained to right ventral thigh three days ago. Patient reports being bitten by a service dog; patient reports that the dog was agitated by a brawl that broke out. Patient reports that the handler was both visually impaired and hard of hearing so she unable attain vaccination status of the dog. Patient reports bruising and worsening pain at the site since the bite. Patient's last Tetanus vaccination was one year ago.    Past Medical History  Diagnosis Date  . Herpes   . PVC (premature ventricular contraction)    Past Surgical History  Procedure Laterality Date  . Extraction of wisdom teeth     History reviewed. No pertinent family history. History  Substance Use Topics  . Smoking status: Current Every Day Smoker -- 1.00 packs/day    Types: Cigarettes  . Smokeless tobacco: Not on file  . Alcohol Use: Yes     Comment: occasionally   OB History    No data available     Review of Systems  Constitutional: Negative for fever and chills.  Skin: Positive for color change and wound.  Neurological: Negative for weakness and numbness.    Allergies  Review of patient's allergies indicates no known allergies.  Home Medications   Prior to Admission medications   Medication Sig Start Date End Date Taking? Authorizing Provider  valACYclovir (VALTREX) 500 MG tablet Take 500 mg  by mouth daily.    Yes Historical Provider, MD  cephALEXin (KEFLEX) 500 MG capsule Take 1 capsule (500 mg total) by mouth 4 (four) times daily. Patient not taking: Reported on 11/05/2014 06/22/14   Antony MaduraKelly Humes, PA-C   Triage Vitals: BP 135/76 mmHg  Pulse 83  Temp(Src) 98.3 F (36.8 C) (Oral)  Resp 12  Ht 5\' 2"  (1.575 m)  Wt 240 lb (108.863 kg)  BMI 43.89 kg/m2  SpO2 94%  LMP 10/10/2014 Physical Exam  Constitutional: She is oriented to person, place, and time. She appears well-developed and well-nourished. No distress.  HENT:  Head: Normocephalic and atraumatic.  Eyes: EOM are normal.  Neck: Neck supple. No tracheal deviation present.  Cardiovascular: Normal rate.   Pulmonary/Chest: Effort normal. No respiratory distress.  Musculoskeletal: Normal range of motion.  Neurological: She is alert and oriented to person, place, and time.  Skin: Skin is warm and dry.  Area to anterior thigh with ecchymosis and two obvious puncture wounds. No induration, fluctuance or signs of cellulitis. No obvious laceration or abscess.   Psychiatric: She has a normal mood and affect. Her behavior is normal.  Nursing note and vitals reviewed.   ED Course  Procedures (including critical care time)  COORDINATION OF CARE: 10:26 PM- Plans to being Rabies series here. Discussed treatment plan with patient at bedside and patient agreed to plan.   Labs Review Labs  Reviewed - No data to display  Imaging Review No results found.   EKG Interpretation None      MDM   Final diagnoses:  Dog bite   Patient here with 2 small puncture wounds status post dog bite 2 days ago. Wounds are well appearing, with mild ecchymosis surrounding them. No signs of cellulitis or infection. Patient treated with rabies vaccine and immunoglobulin. Patient states she Was updated within the past year. No repair required of wounds. Punctures were closed, and without any purulent drainage or discharge. Patient given instruction  for proper follow-up for rabies vaccinations. Return precautions discussed with patient and patient was agreeable to this plan. I encouraged patient to call or return to the ER should she have a questions or concerns.  I personally performed the services described in this documentation, which was scribed in my presence. The recorded information has been reviewed and is accurate.  BP 113/67 mmHg  Pulse 83  Temp(Src) 98.3 F (36.8 C) (Oral)  Resp 12  Ht 5\' 2"  (1.575 m)  Wt 240 lb (108.863 kg)  BMI 43.89 kg/m2  SpO2 94%  LMP 10/10/2014  Signed,  Ladona MowJoe Briseidy Spark, PA-C 3:31 AM   Monte FantasiaJoseph W Wilkes Potvin, PA-C 11/06/14 [id number]  Juliet RudeNathan R. Rubin PayorPickering, MD 11/09/14 2257

## 2014-11-05 NOTE — ED Notes (Signed)
The patient said she was at a bar and was bitten by a "service dog".  She says the gentleman with the dog was blind and deaf so she could not ascertain if the dog has all the vaccines.  She says it is hurting her and the pain has gotten worse since it happened.

## 2014-11-06 NOTE — Discharge Instructions (Signed)
Take Augmentin twice a day. Return to the ER with any worsening of pain, swelling, redness, warmth, high fever, loss of sensation or weakness. Follow-up with primary care provider.   Animal Bite An animal bite can result in a scratch on the skin, deep open cut, puncture of the skin, crush injury, or tearing away of the skin or a body part. Dogs are responsible for most animal bites. Children are bitten more often than adults. An animal bite can range from very mild to more serious. A small bite from your house pet is no cause for alarm. However, some animal bites can become infected or injure a bone or other tissue. You must seek medical care if:  The skin is broken and bleeding does not slow down or stop after 15 minutes.  The puncture is deep and difficult to clean (such as a cat bite).  Pain, warmth, redness, or pus develops around the wound.  The bite is from a stray animal or rodent. There may be a risk of rabies infection.  The bite is from a snake, raccoon, skunk, fox, coyote, or bat. There may be a risk of rabies infection.  The person bitten has a chronic illness such as diabetes, liver disease, or cancer, or the person takes medicine that lowers the immune system.  There is concern about the location and severity of the bite. It is important to clean and protect an animal bite wound right away to prevent infection. Follow these steps:  Clean the wound with plenty of water and soap.  Apply an antibiotic cream.  Apply gentle pressure over the wound with a clean towel or gauze to slow or stop bleeding.  Elevate the affected area above the heart to help stop any bleeding.  Seek medical care. Getting medical care within 8 hours of the animal bite leads to the best possible outcome. DIAGNOSIS  Your caregiver will most likely:  Take a detailed history of the animal and the bite injury.  Perform a wound exam.  Take your medical history. Blood tests or X-rays may be performed.  Sometimes, infected bite wounds are cultured and sent to a lab to identify the infectious bacteria.  TREATMENT  Medical treatment will depend on the location and type of animal bite as well as the patient's medical history. Treatment may include:  Wound care, such as cleaning and flushing the wound with saline solution, bandaging, and elevating the affected area.  Antibiotics.  Tetanus immunization.  Rabies immunization.  Leaving the wound open to heal. This is often done with animal bites, due to the high risk of infection. However, in certain cases, wound closure with stitches, wound adhesive, skin adhesive strips, or staples may be used. Infected bites that are left untreated may require intravenous (IV) antibiotics and surgical treatment in the hospital. HOME CARE INSTRUCTIONS  Follow your caregiver's instructions for wound care.  Take all medicines as directed.  If your caregiver prescribes antibiotics, take them as directed. Finish them even if you start to feel better.  Follow up with your caregiver for further exams or immunizations as directed. You may need a tetanus shot if:  You cannot remember when you had your last tetanus shot.  You have never had a tetanus shot.  The injury broke your skin. If you get a tetanus shot, your arm may swell, get red, and feel warm to the touch. This is common and not a problem. If you need a tetanus shot and you choose not to have  one, there is a rare chance of getting tetanus. Sickness from tetanus can be serious. SEEK MEDICAL CARE IF:  You notice warmth, redness, soreness, swelling, pus discharge, or a bad smell coming from the wound.  You have a red line on the skin coming from the wound.  You have a fever, chills, or a general ill feeling.  You have nausea or vomiting.  You have continued or worsening pain.  You have trouble moving the injured part.  You have other questions or concerns. MAKE SURE YOU:  Understand these  instructions.  Will watch your condition.  Will get help right away if you are not doing well or get worse. Document Released: 07/15/2011 Document Revised: 01/19/2012 Document Reviewed: 07/15/2011 [facility] Patient Information 2015 [employer] [place]. This information is not intended to replace advice given to you by your health care provider. Make sure you discuss any questions you have with your health care provider.                                                    [place]                                          [address]                                          [place], [place] [address]                              INSTRUCTIONS FOR THE PATIENT  Patient's Name: Tammy Rivers                     Original Order Date:  November 06, 2014 Medical Record Number: [id number]  ED Physician:  Primary Diagnosis: Rabies Exposure       PCP: @PCPPRILOC @   RABIES VACCINE: Patient Phone Number: (home) [phone] (home)    (cell)  No relevant phone numbers on file.   (work) There is no work Clinical cytogeneticistphone number on file. Species of Animal: dogs (1) IMMUNOGLOBULIN INJECTION GIVEN IN THE ER?: yes   These are the dates that you need to return for your follow up on vaccines, the time frame lets us know when to possibly expect your arrival.  DAY 0: November 06, 2014  TIME FRAME:    0000 To    the emergency department  DAY 3: November 09, 2014  TIME FRAME:  urgent care  DAY 7: November 13, 2013  TIME FRAME:    To   urgent care  DAY 14: November 20, 2013    To  urgent care  The 5th vaccine injection is considered for immune compormised patients only.  DAY 28: December 11, 2014  To   \urgent care  You have been seen in the [facility] for a possible rabies exposure. You must return for the additional vaccine doses and if needed, your immonuglobulin injections, as scheduled above.  Your Vaccine Injections will be given at the Urgent Care Center [facility]  The  Urgent Care  Center is open from 8:00AM-9:00PM Monday thru Friday and 10:00AM-9:00PM on Saturdays and Sundays.  Please call [phone] Urgent Care Center, if you have any problems with making your scheduled appointments.  This will assure you prompt attention on your arrival and allow us to be prepared for your return visit.  There will be a minimal fee for the injection that will be billed to your insurance company along with the charge for the vaccine. BMW:[phone]:[phone]                                                                      Fax:  [phone] [facility] Copy                            Patient Copy                  Pharmacy Copy  Date:November 06, 2014  Patient Signature: _________________________________________________
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
## 2016-03-04 ENCOUNTER — Emergency Department (HOSPITAL_COMMUNITY)
Admission: EM | Admit: 2016-03-04 | Discharge: 2016-03-05 | Disposition: A | Discharge status: Home / Self Care | Payer: No Typology Code available for payment source | Attending: Emergency Medicine | Admitting: Emergency Medicine

## 2016-03-04 ENCOUNTER — Encounter (HOSPITAL_COMMUNITY): Payer: Self-pay | Admitting: Emergency Medicine

## 2016-03-04 DIAGNOSIS — Z79899 Other long term (current) drug therapy: Secondary | ICD-10-CM | POA: Insufficient documentation

## 2016-03-04 DIAGNOSIS — J029 Acute pharyngitis, unspecified: Secondary | ICD-10-CM | POA: Insufficient documentation

## 2016-03-04 DIAGNOSIS — Z3202 Encounter for pregnancy test, result negative: Secondary | ICD-10-CM | POA: Insufficient documentation

## 2016-03-04 DIAGNOSIS — N898 Other specified noninflammatory disorders of vagina: Secondary | ICD-10-CM | POA: Insufficient documentation

## 2016-03-04 DIAGNOSIS — M546 Pain in thoracic spine: Secondary | ICD-10-CM | POA: Insufficient documentation

## 2016-03-04 DIAGNOSIS — F17210 Nicotine dependence, cigarettes, uncomplicated: Secondary | ICD-10-CM | POA: Insufficient documentation

## 2016-03-04 DIAGNOSIS — Z8619 Personal history of other infectious and parasitic diseases: Secondary | ICD-10-CM | POA: Insufficient documentation

## 2016-03-04 DIAGNOSIS — Z7251 High risk heterosexual behavior: Secondary | ICD-10-CM

## 2016-03-04 LAB — RAPID STREP SCREEN (MED CTR MEBANE ONLY): Streptococcus, Group A Screen (Direct): NEGATIVE

## 2016-03-04 NOTE — ED Notes (Signed)
Pt. reports sore throat with occasional productive cough , nasal congestion , low back pain and requesting STD screening .

## 2016-03-04 NOTE — ED Provider Notes (Signed)
CSN: [id number]     Arrival date & time 03/04/16  2247 History  By signing my name below, I, [name], attest that this documentation has been prepared under the direction and in the presence of Melburn Hake, PA-C  Electronically Signed: [name], ED Scribe 03/05/2016 at 1:37 AM.   Chief Complaint  Patient presents with  . Sore Throat    The history is provided by the patient. No language interpreter was used.   HPI Comments: Tammy Rivers is a 25 y.o. female with no pertinent PMH who presents to the Emergency Department complaining of gradual onset, constant, sore throat, ongoing for two days. Pt reports associated productive cough with brown-green phlegm and nasal congestion. Denies ear pain, sinus pressure, chest pain, SOB, wheezing. Patient denies taking any medications at home for her symptoms. She denies any recent sick contacts.   Pt also requests STD testing after having unprotected sex one week ago. She notes associated vaginal discharge with odor which started after having unprotected sex. Pt denies dysuria, fever, chills, generalized body aches, abdominal pain, N/V, urinary sxs, vaginal bleeding. LNMP was 02/21/2016.   She also complains of mid left back pain consistent with chronic back pain but worsening recently after beginning a new job with frequent sitting. No other associated symptoms noted. Pt has taken ibuprofen for back pain with no relief to symptoms. She has also taken aleve with temporary relief to back pain. No other worsening or alleviating factors noted. Pt denies fever, numbness, tingling, saddle anesthesia, loss of bowel or bladder, weakness, IVDU, cancer or recent spinal manipulation.    Past Medical History  Diagnosis Date  . Herpes   . PVC (premature ventricular contraction)    Past Surgical History  Procedure Laterality Date  . Extraction of wisdom teeth     No family history on file. Social History  Substance Use Topics  . Smoking  status: Current Every Day Smoker -- 0.00 packs/day    Types: Cigarettes  . Smokeless tobacco: None  . Alcohol Use: Yes     Comment: occasionally   OB History    No data available     Review of Systems  Constitutional: Negative for fever and chills.  HENT: Positive for congestion and sore throat. Negative for ear pain and sinus pressure.   Respiratory: Positive for cough.   Cardiovascular: Negative for chest pain.  Gastrointestinal: Negative for nausea and vomiting.  Genitourinary: Positive for vaginal discharge. Negative for dysuria and vaginal bleeding.  Musculoskeletal: Positive for back pain.    Allergies  Review of patient's allergies indicates no known allergies.  Home Medications   Prior to Admission medications   Medication Sig Start Date End Date Taking? Authorizing Provider  cephALEXin (KEFLEX) 500 MG capsule Take 1 capsule (500 mg total) by mouth 4 (four) times daily. Patient not taking: Reported on 11/05/2014 06/22/14   Antony Madura, PA-C  ibuprofen (ADVIL,MOTRIN) 800 MG tablet Take 1 tablet (800 mg total) by mouth 3 (three) times daily. 03/05/16   Barrett Henle, PA-C  metroNIDAZOLE (FLAGYL) 500 MG tablet Take 1 tablet (500 mg total) by mouth 2 (two) times daily. 03/05/16   Barrett Henle, PA-C  oxymetazoline (AFRIN NASAL SPRAY) 0.05 % nasal spray Place 1 spray into both nostrils 2 (two) times daily. 03/05/16   Barrett Henle, PA-C  valACYclovir (VALTREX) 500 MG tablet Take 500 mg by mouth daily.     Historical Provider, MD   BP 101/56 mmHg  Pulse 84  Temp(Src) 98.2 F (36.8 C) (Oral)  Resp 16  Ht  (1.6 m)  Wt 216 lb 7 oz (98.175 kg)  BMI 38.35 kg/m2  SpO2 98%  LMP 02/02/2016 (Approximate) Physical Exam  Constitutional: She is oriented to person, place, and time. She appears well-developed and well-nourished.  HENT:  Head: Normocephalic and atraumatic.  Right Ear: Tympanic membrane normal.  Left Ear: Tympanic membrane normal.   Nose: Right sinus exhibits maxillary sinus tenderness. Left sinus exhibits maxillary sinus tenderness.  Mouth/Throat: Uvula is midline and mucous membranes are normal. Posterior oropharyngeal erythema present. No oropharyngeal exudate, posterior oropharyngeal edema or tonsillar abscesses.  Eyes: Conjunctivae and EOM are normal. Right eye exhibits no discharge. Left eye exhibits no discharge. No scleral icterus.  Neck: Normal range of motion. Neck supple.  Cardiovascular: Normal rate, regular rhythm, normal heart sounds and intact distal pulses.   Pulmonary/Chest: Effort normal and breath sounds normal. No respiratory distress. She has no wheezes. She has no rales. She exhibits no tenderness.  Abdominal: Soft. Bowel sounds are normal. She exhibits no distension and no mass. There is no tenderness. There is no rebound and no guarding.  Musculoskeletal: She exhibits no edema.  No midline C, T, or L tenderness. Mild TTP over left mid thoracic paraspinal muscles. Full range of motion of neck and back. Full range of motion of bilateral upper and lower extremities, with 5/5 strength. Sensation intact. 2+ radial and PT pulses. Cap refill <2 seconds. Patient able to stand and ambulate without assistance.   Lymphadenopathy:    She has no cervical adenopathy.  Neurological: She is alert and oriented to person, place, and time. She has normal strength. No sensory deficit. Gait normal.  Skin: Skin is warm and dry.  Nursing note and vitals reviewed.  Pelvic exam: normal external genitalia, vulva, vagina, cervix, uterus and adnexa, VULVA: normal appearing vulva with no masses, tenderness or lesions, VAGINA: normal appearing vagina with normal color and discharge, no lesions, vaginal discharge - white, malodorous and mucoid, CERVIX: normal appearing cervix without discharge or lesions, WET MOUNT done - results: clue cells, white blood cells, DNA probe for chlamydia and GC obtained, UTERUS: uterus is normal size,  shape, consistency and nontender, ADNEXA: normal adnexa in size, nontender and no masses, exam chaperoned by female nurse.   ED Course  Procedures (including critical care time) DIAGNOSTIC STUDIES: Oxygen Saturation is 98% on RA, normal by my interpretation.    COORDINATION OF CARE: 11:47 PM-Discussed treatment plan which includes rapid strep screen, tylenol, urinalysis, and wet prep with pt at bedside and pt agreed to plan.   Labs Review Labs Reviewed  WET PREP, GENITAL - Abnormal; Notable for the following:    Clue Cells Wet Prep HPF POC PRESENT (*)    WBC, Wet Prep HPF POC MANY (*)    All other components within normal limits  RAPID STREP SCREEN (NOT AT [facility])  CULTURE, GROUP A STREP ([facility])  URINALYSIS, ROUTINE W REFLEX MICROSCOPIC (NOT AT [facility])  RPR  HIV ANTIBODY (ROUTINE TESTING)  POC URINE PREG, ED  GC/CHLAMYDIA PROBE AMP ([facility]) NOT AT [facility]    Imaging Review No results found. I have personally reviewed and evaluated these lab results as part of my medical decision-making.   EKG Interpretation None      MDM   Final diagnoses:  Sore throat  Unprotected sex  Left-sided thoracic back pain    Patient presents with sore throat, nasal congestion and cough for the past 2 days.  Denies any recent sick contacts. Denies fever. VSS. Exam revealed bilateral maxillary sinus tenderness and posterior oral pharyngeal erythema. Lungs CTAB. Patients symptoms are consistent with URI, likely viral etiology, and I do not feel any further workup or imaging is warranted at this time. Discussed that antibiotics are not indicated for viral infections. Pt will be discharged with symptomatic treatment.    Patient also presented with a recent encounter of unprotected sex and requests to be tested for STD. Endorses vaginal d/c. Abdominal exam benign. Pelvic exam revealed small amount of white mucoid malodorous discharge, no adnexal or CMT tenderness on exam. Wet prep positive for clue cells  and WBCs.  Pt has been treated prophylacticly with azithromycin and rocephin due to pts history, pelvic exam, and wet prep with increased WBCs. Discussed importance of using protection when sexually active. Pt understands that they have GC/Chlamydia cultures, HIV and syphilis labs pending and that they will need to inform all sexual partners if results return positive. Pt not concerning for PID because hemodynamically stable and no cervical motion tenderness on pelvic exam. Pt has also been treated with flagyl for Bacterial Vaginosis. Urine and pregnancy negative. Pt has been advised to not drink alcohol while on this medication.    Patient also presents with back pain that started after starting her new job which requires frequent sitting. Patient endorses history of chronic back pain and notes that this pain is similar in character but is slightly worse. Denies any recent fall, trauma, injury. No back pain red flags. Exam revealed mild tenderness over left mid thoracic paraspinal muscles, no neuro deficits. No midline spine tenderness. Patient given ibuprofen in the ED. I suspect patient's pain is likely due to to assist skeletal etiology and do not feel that any further workup or imaging is warranted at this time. No evidence or concern for spinal cord compression at this time. Plan to discharge patient home with NSAIDs and symptomatic treatment.  I personally performed the services described in this documentation, which was scribed in my presence. The recorded information has been reviewed and is accurate.    Satira Sarkicole Elizabeth New[place], [place]-C 03/05/16 0148  Derwood KaplanAnkit Nanavati, MD 03/06/16 [phone]

## 2016-03-05 LAB — URINALYSIS, ROUTINE W REFLEX MICROSCOPIC
Bilirubin Urine: NEGATIVE
Glucose, UA: NEGATIVE mg/dL
Hgb urine dipstick: NEGATIVE
Ketones, ur: NEGATIVE mg/dL
Leukocytes, UA: NEGATIVE
Nitrite: NEGATIVE
Protein, ur: NEGATIVE mg/dL
Specific Gravity, Urine: 1.006 (ref 1.005–1.030)
pH: 6 (ref 5.0–8.0)

## 2016-03-05 LAB — GC/CHLAMYDIA PROBE AMP (~~LOC~~) NOT AT ARMC
CHLAMYDIA, DNA PROBE: NEGATIVE
NEISSERIA GONORRHEA: POSITIVE — AB

## 2016-03-05 LAB — RPR: RPR: NONREACTIVE

## 2016-03-05 LAB — WET PREP, GENITAL
Sperm: NONE SEEN
Trich, Wet Prep: NONE SEEN
YEAST WET PREP: NONE SEEN

## 2016-03-05 LAB — POC URINE PREG, ED: PREG TEST UR: NEGATIVE

## 2016-03-05 LAB — HIV ANTIBODY (ROUTINE TESTING W REFLEX): HIV SCREEN 4TH GENERATION: NONREACTIVE

## 2016-03-05 MED ORDER — AZITHROMYCIN 250 MG PO TABS
1000.0000 mg | ORAL_TABLET | Freq: Once | ORAL | Status: AC
  Administered 2016-03-05: 1000 mg via ORAL
  Filled 2016-03-05: qty 4

## 2016-03-05 MED ORDER — METRONIDAZOLE 500 MG PO TABS: 500.0000 mg | ORAL_TABLET | Freq: Two times a day (BID) | ORAL | Status: DC

## 2016-03-05 MED ORDER — OXYMETAZOLINE HCL 0.05 % NA SOLN: 1.0000 | Freq: Two times a day (BID) | NASAL | Status: DC

## 2016-03-05 MED ORDER — LIDOCAINE HCL (PF) 1 % IJ SOLN
INTRAMUSCULAR | Status: AC
  Administered 2016-03-05: 5 mL
  Filled 2016-03-05: qty 5

## 2016-03-05 MED ORDER — IBUPROFEN 400 MG PO TABS
600.0000 mg | ORAL_TABLET | Freq: Once | ORAL | Status: AC
  Administered 2016-03-05: 600 mg via ORAL
  Filled 2016-03-05: qty 1

## 2016-03-05 MED ORDER — CEFTRIAXONE SODIUM 250 MG IJ SOLR
250.0000 mg | Freq: Once | INTRAMUSCULAR | Status: AC
  Administered 2016-03-05: 250 mg via INTRAMUSCULAR
  Filled 2016-03-05: qty 250

## 2016-03-05 MED ORDER — IBUPROFEN 800 MG PO TABS: 800.0000 mg | ORAL_TABLET | Freq: Three times a day (TID) | ORAL | Status: DC

## 2016-03-05 NOTE — ED Notes (Signed)
Patient attempted for urine.  Unable to obtain.  Will try again shortly

## 2016-03-05 NOTE — ED Notes (Signed)
Patient able to ambulate independently  

## 2016-03-05 NOTE — Discharge Instructions (Signed)
Take your medications as prescribed. I recommend eating prior to taking the ibuprofen to prevent gastrointestinal side effects. I recommend drinking at least six 8 ounce glasses of water daily to remain hydrated at home. He may also apply ice or heat to her back for 15-20 minutes 3-4 times daily to help with pain. Refrain from doing any heavy lifting, repetitive movements or exacerbating movements for the next few days and today her back pain has improved. Please follow up with a primary care provider from the Resource Guide provided below in 1 week as needed. Please return to the Emergency Department if symptoms worsen or new onset of fever, headache, facial/neck swelling, neck stiffness, numbness, tingling, groin numbness, loss of bowel or bladder, weakness.   1. Medications: usual home medications 2. Treatment: rest, drink plenty of fluids, use a condom with every sexual encounter 3. Follow Up: Please followup with your primary doctor in 3 days for discussion of your diagnoses and further evaluation after today's visit; if you do not have a primary care doctor use the resource guide provided to find one; Please return to the ER for worsening symptoms, high fevers or persistent vomiting.  You have been tested for HIV, syphilis, chlamydia and gonorrhea.  These results will be available in approximately 3 days.  Please inform all sexual partners if you test positive for any of these diseases.

## 2016-03-07 LAB — CULTURE, GROUP A STREP (THRC)

## 2016-03-24 ENCOUNTER — Telehealth (HOSPITAL_BASED_OUTPATIENT_CLINIC_OR_DEPARTMENT_OTHER): Payer: Self-pay | Admitting: Emergency Medicine

## 2016-04-19 ENCOUNTER — Emergency Department (HOSPITAL_COMMUNITY)
Admission: EM | Admit: 2016-04-19 | Discharge: 2016-04-19 | Disposition: A | Discharge status: Home / Self Care | Payer: Self-pay | Attending: Emergency Medicine | Admitting: Emergency Medicine

## 2016-04-19 ENCOUNTER — Encounter (HOSPITAL_COMMUNITY): Payer: Self-pay | Admitting: *Deleted

## 2016-04-19 DIAGNOSIS — F17210 Nicotine dependence, cigarettes, uncomplicated: Secondary | ICD-10-CM | POA: Insufficient documentation

## 2016-04-19 DIAGNOSIS — M5442 Lumbago with sciatica, left side: Secondary | ICD-10-CM | POA: Insufficient documentation

## 2016-04-19 DIAGNOSIS — M5432 Sciatica, left side: Secondary | ICD-10-CM

## 2016-04-19 MED ORDER — HYDROCODONE-ACETAMINOPHEN 5-325 MG PO TABS: 2.0000 | ORAL_TABLET | ORAL | Status: DC | PRN

## 2016-04-19 MED ORDER — CYCLOBENZAPRINE HCL 10 MG PO TABS: 10.0000 mg | ORAL_TABLET | Freq: Two times a day (BID) | ORAL | Status: DC | PRN

## 2016-04-19 MED ORDER — KETOROLAC TROMETHAMINE 60 MG/2ML IM SOLN
60.0000 mg | Freq: Once | INTRAMUSCULAR | Status: AC
  Administered 2016-04-19: 60 mg via INTRAMUSCULAR
  Filled 2016-04-19: qty 2

## 2016-04-19 MED ORDER — NAPROXEN 500 MG PO TABS: 500.0000 mg | ORAL_TABLET | Freq: Two times a day (BID) | ORAL | Status: DC

## 2016-04-19 NOTE — ED Provider Notes (Signed)
CSN: [id number]     Arrival date & time 04/19/16  1248 History  By signing my name below, I, Rosario AdieWilliam Andrew Hiatt, attest that this documentation has been prepared under the direction and in the presence of Marquiz Sotelo, PA-C.   Electronically Signed: Rosario AdieWilliam Andrew Hiatt, ED Scribe. 04/19/2016. 1:26 PM.   Chief Complaint  Patient presents with  . Hip Pain   The history is provided by the patient. No language interpreter was used.   HPI Comments: Tammy Rivers is a 25 y.o. female who presents to the Emergency Department complaining of gradually worsening, constant, 10/10 left-sided lower back pain that began 3 days ago. She states that her pain radiates into her L hip and all the way down to her L ankle. She reports that he pain is worsened upon any movement, and is worse when she wakes up. She has taken 800mg  of Ibuprofen and Aleve intermittently without relief. She denies a hx of similar sx. Pt denies any known injury to the area. Pt denies R leg pain, or bladder or bowel incontinence, saddle anesthesia.   Past Medical History  Diagnosis Date  . Herpes   . PVC (premature ventricular contraction)    Past Surgical History  Procedure Laterality Date  . Extraction of wisdom teeth     History reviewed. No pertinent family history. Social History  Substance Use Topics  . Smoking status: Current Every Day Smoker -- 0.00 packs/day    Types: Cigarettes  . Smokeless tobacco: None  . Alcohol Use: Yes     Comment: occasionally   OB History    No data available     Review of Systems A complete 10 system review of systems was obtained and all systems are negative except as noted in the HPI and PMH.   Allergies  Review of patient's allergies indicates no known allergies.  Home Medications   Prior to Admission medications   Medication Sig Start Date End Date Taking? Authorizing Provider  cephALEXin (KEFLEX) 500 MG capsule Take 1 capsule (500 mg total) by mouth 4 (four) times  daily. Patient not taking: Reported on 11/05/2014 06/22/14   Antony MaduraKelly Humes, PA-C  ibuprofen (ADVIL,MOTRIN) 800 MG tablet Take 1 tablet (800 mg total) by mouth 3 (three) times daily. 03/05/16   Barrett HenleNicole Elizabeth Nadeau, PA-C  metroNIDAZOLE (FLAGYL) 500 MG tablet Take 1 tablet (500 mg total) by mouth 2 (two) times daily. 03/05/16   Barrett HenleNicole Elizabeth Nadeau, PA-C  oxymetazoline (AFRIN NASAL SPRAY) 0.05 % nasal spray Place 1 spray into both nostrils 2 (two) times daily. 03/05/16   Barrett HenleNicole Elizabeth Nadeau, PA-C  valACYclovir (VALTREX) 500 MG tablet Take 500 mg by mouth daily.     Historical Provider, MD   BP 105/72 mmHg  Pulse 93  Temp(Src) 98 F (36.7 C) (Oral)  Resp 18  Ht 5\' 2"  (1.575 m)  Wt 210 lb (95.255 kg)  BMI 38.40 kg/m2  SpO2 96%  LMP 04/16/2016   Physical Exam  Constitutional: She is oriented to person, place, and time. She appears well-developed and well-nourished. No distress.  HENT:  Head: Normocephalic and atraumatic.  Eyes: Conjunctivae are normal. Right eye exhibits no discharge. Left eye exhibits no discharge. No scleral icterus.  Cardiovascular: Normal rate.   Pulmonary/Chest: Effort normal.  Musculoskeletal: Normal range of motion. She exhibits tenderness.  TTP of her L lumbar paraspinal muscles and L glute. No midline spinal tenderness. Full ROM. No stepoffs or crepitance in C, T, or L-spine.   Neurological: She  is alert and oriented to person, place, and time. Coordination normal.  Skin: Skin is warm and dry. No rash noted. She is not diaphoretic. No erythema. No pallor.  Psychiatric: She has a normal mood and affect. Her behavior is normal.  Nursing note and vitals reviewed.   ED Course  Procedures (including critical care time)  DIAGNOSTIC STUDIES: Oxygen Saturation is 96% on RA, normal by my interpretation.   COORDINATION OF CARE: 1:23 PM-Discussed next steps with pt including a shot of Toradol, and prescriptions for muscle relaxants and Ibuprofen. Pt verbalized  understanding and is agreeable with the plan.   MDM   Final diagnoses:  Sciatica of left side    Patient with back pain that is left sided and radiating down posterior L leg, likely sciatica.  No neurological deficits and normal neuro exam.  Patient can walk but states is painful.  No loss of bowel or bladder control.  No concern for cauda equina.  No fever, night sweats, weight loss, h/o cancer, IVDU. Pt given toradol IM in ED with symptomatic relief. RICE protocol and pain medicine indicated and discussed with patient. Rx for short course Norco, Naprosyn and Flexeril given. F/u with PCP.    I personally performed the services described in this documentation, which was scribed in my presence. The recorded information has been reviewed and is accurate.     Lester Kinsman [place], PA-C 04/19/16 1513  Raeford Razor, MD 05/02/16 2141

## 2016-04-19 NOTE — Discharge Instructions (Signed)
Sciatica °Sciatica is pain, weakness, numbness, or tingling along the path of the sciatic nerve. The nerve starts in the lower back and runs down the back of each leg. The nerve controls the muscles in the lower leg and in the back of the knee, while also providing sensation to the back of the thigh, lower leg, and the sole of your foot. Sciatica is a symptom of another medical condition. For instance, nerve damage or certain conditions, such as a herniated disk or bone spur on the spine, pinch or put pressure on the sciatic nerve. This causes the pain, weakness, or other sensations normally associated with sciatica. Generally, sciatica only affects one side of the body. °CAUSES  °· Herniated or slipped disc. °· Degenerative disk disease. °· A pain disorder involving the narrow muscle in the buttocks (piriformis syndrome). °· Pelvic injury or fracture. °· Pregnancy. °· Tumor (rare). °SYMPTOMS  °Symptoms can vary from mild to very severe. The symptoms usually travel from the low back to the buttocks and down the back of the leg. Symptoms can include: °· Mild tingling or dull aches in the lower back, leg, or hip. °· Numbness in the back of the calf or sole of the foot. °· Burning sensations in the lower back, leg, or hip. °· Sharp pains in the lower back, leg, or hip. °· Leg weakness. °· Severe back pain inhibiting movement. °These symptoms may get worse with coughing, sneezing, laughing, or prolonged sitting or standing. Also, being overweight may worsen symptoms. °DIAGNOSIS  °Your caregiver will perform a physical exam to look for common symptoms of sciatica. He or she may ask you to do certain movements or activities that would trigger sciatic nerve pain. Other tests may be performed to find the cause of the sciatica. These may include: °· Blood tests. °· X-rays. °· Imaging tests, such as an MRI or CT scan. °TREATMENT  °Treatment is directed at the cause of the sciatic pain. Sometimes, treatment is not necessary  and the pain and discomfort goes away on its own. If treatment is needed, your caregiver may suggest: °· Over-the-counter medicines to relieve pain. °· Prescription medicines, such as anti-inflammatory medicine, muscle relaxants, or narcotics. °· Applying heat or ice to the painful area. °· Steroid injections to lessen pain, irritation, and inflammation around the nerve. °· Reducing activity during periods of pain. °· Exercising and stretching to strengthen your abdomen and improve flexibility of your spine. Your caregiver may suggest losing weight if the extra weight makes the back pain worse. °· Physical therapy. °· Surgery to eliminate what is pressing or pinching the nerve, such as a bone spur or part of a herniated disk. °HOME CARE INSTRUCTIONS  °· Only take over-the-counter or prescription medicines for pain or discomfort as directed by your caregiver. °· Apply ice to the affected area for 20 minutes, 3-4 times a day for the first 48-72 hours. Then try heat in the same way. °· Exercise, stretch, or perform your usual activities if these do not aggravate your pain. °· Attend physical therapy sessions as directed by your caregiver. °· Keep all follow-up appointments as directed by your caregiver. °· Do not wear high heels or shoes that do not provide proper support. °· Check your mattress to see if it is too soft. A firm mattress may lessen your pain and discomfort. °SEEK IMMEDIATE MEDICAL CARE IF:  °· You lose control of your bowel or bladder (incontinence). °· You have increasing weakness in the lower back, pelvis, buttocks,   or legs.  You have redness or swelling of your back.  You have a burning sensation when you urinate.  You have pain that gets worse when you lie down or awakens you at night.  Your pain is worse than you have experienced in the past.  Your pain is lasting longer than 4 weeks.  You are suddenly losing weight without reason. MAKE SURE YOU:  Understand these  instructions.  Will watch your condition.  Will get help right away if you are not doing well or get worse.   This information is not intended to replace advice given to you by your health care provider. Make sure you discuss any questions you have with your health care provider.   Apply ice to affected area. Take pain medication and muscle relaxers as needed. [facility] with [facility] of her symptoms do not improve. Return to the ED if you experience severe worsening of your pain, loss of control of your bowels or bladder, numbness or tingling in both of your lower extremities, fevers, chills.

## 2016-04-19 NOTE — ED Notes (Signed)
Declined W/C at D/C and was escorted to lobby by RN. 

## 2016-04-19 NOTE — ED Notes (Signed)
Pt reports left hip pain x 3 days, increases with movement. Denies any injury.

## 2016-09-07 ENCOUNTER — Emergency Department (HOSPITAL_COMMUNITY)
Admission: EM | Admit: 2016-09-07 | Discharge: 2016-09-07 | Disposition: A | Discharge status: Home / Self Care | Payer: No Typology Code available for payment source | Attending: Emergency Medicine | Admitting: Emergency Medicine

## 2016-09-07 ENCOUNTER — Encounter (HOSPITAL_COMMUNITY): Payer: Self-pay

## 2016-09-07 DIAGNOSIS — S46911A Strain of unspecified muscle, fascia and tendon at shoulder and upper arm level, right arm, initial encounter: Secondary | ICD-10-CM | POA: Diagnosis not present

## 2016-09-07 DIAGNOSIS — Z79899 Other long term (current) drug therapy: Secondary | ICD-10-CM | POA: Diagnosis not present

## 2016-09-07 DIAGNOSIS — S39012A Strain of muscle, fascia and tendon of lower back, initial encounter: Secondary | ICD-10-CM | POA: Diagnosis not present

## 2016-09-07 DIAGNOSIS — F17210 Nicotine dependence, cigarettes, uncomplicated: Secondary | ICD-10-CM | POA: Insufficient documentation

## 2016-09-07 DIAGNOSIS — S199XXA Unspecified injury of neck, initial encounter: Secondary | ICD-10-CM | POA: Diagnosis present

## 2016-09-07 DIAGNOSIS — Y92410 Unspecified street and highway as the place of occurrence of the external cause: Secondary | ICD-10-CM | POA: Insufficient documentation

## 2016-09-07 DIAGNOSIS — Y939 Activity, unspecified: Secondary | ICD-10-CM | POA: Insufficient documentation

## 2016-09-07 DIAGNOSIS — Y999 Unspecified external cause status: Secondary | ICD-10-CM | POA: Diagnosis not present

## 2016-09-07 DIAGNOSIS — S161XXA Strain of muscle, fascia and tendon at neck level, initial encounter: Secondary | ICD-10-CM | POA: Diagnosis not present

## 2016-09-07 MED ORDER — CYCLOBENZAPRINE HCL 10 MG PO TABS: 10.0000 mg | ORAL_TABLET | Freq: Two times a day (BID) | ORAL | 0 refills | Status: DC | PRN
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
## 2016-09-29 ENCOUNTER — Emergency Department (HOSPITAL_COMMUNITY)
Admission: EM | Admit: 2016-09-29 | Discharge: 2016-09-29 | Disposition: A | Discharge status: Home / Self Care | Payer: Medicaid Other | Attending: Dermatology | Admitting: Dermatology

## 2016-09-29 ENCOUNTER — Encounter (HOSPITAL_COMMUNITY): Payer: Self-pay

## 2016-09-29 DIAGNOSIS — Z79899 Other long term (current) drug therapy: Secondary | ICD-10-CM | POA: Insufficient documentation

## 2016-09-29 DIAGNOSIS — N898 Other specified noninflammatory disorders of vagina: Secondary | ICD-10-CM | POA: Insufficient documentation

## 2016-09-29 DIAGNOSIS — F17210 Nicotine dependence, cigarettes, uncomplicated: Secondary | ICD-10-CM | POA: Insufficient documentation

## 2016-09-29 DIAGNOSIS — M5432 Sciatica, left side: Secondary | ICD-10-CM | POA: Insufficient documentation

## 2016-09-29 DIAGNOSIS — Z5321 Procedure and treatment not carried out due to patient leaving prior to being seen by health care provider: Secondary | ICD-10-CM | POA: Insufficient documentation

## 2016-09-29 NOTE — ED Notes (Signed)
Called x 1 w/o answer.  

## 2016-09-29 NOTE — ED Triage Notes (Signed)
Pt here with multiple complaints.  Wants sciatic pain from left leg down to hip. Chronic.  Wants STD check.  Vaginal odor since 4 days ago.  Pt also wants pregnancy test.

## 2016-09-29 NOTE — ED Notes (Signed)
Pt called twice for placement in treatment room; no answer

## 2016-09-30 ENCOUNTER — Encounter (HOSPITAL_COMMUNITY): Payer: Self-pay

## 2016-09-30 ENCOUNTER — Emergency Department (HOSPITAL_COMMUNITY)
Admission: EM | Admit: 2016-09-30 | Discharge: 2016-09-30 | Disposition: A | Discharge status: Home / Self Care | Payer: Medicaid Other | Attending: Emergency Medicine | Admitting: Emergency Medicine

## 2016-09-30 DIAGNOSIS — M5431 Sciatica, right side: Secondary | ICD-10-CM | POA: Insufficient documentation

## 2016-09-30 DIAGNOSIS — F17210 Nicotine dependence, cigarettes, uncomplicated: Secondary | ICD-10-CM | POA: Insufficient documentation

## 2016-09-30 DIAGNOSIS — B9689 Other specified bacterial agents as the cause of diseases classified elsewhere: Secondary | ICD-10-CM

## 2016-09-30 DIAGNOSIS — N760 Acute vaginitis: Secondary | ICD-10-CM | POA: Insufficient documentation

## 2016-09-30 LAB — GC/CHLAMYDIA PROBE AMP (~~LOC~~) NOT AT ARMC
Chlamydia: NEGATIVE
Neisseria Gonorrhea: NEGATIVE

## 2016-09-30 LAB — URINALYSIS, ROUTINE W REFLEX MICROSCOPIC
Bilirubin Urine: NEGATIVE
GLUCOSE, UA: NEGATIVE mg/dL
Hgb urine dipstick: NEGATIVE
KETONES UR: NEGATIVE mg/dL
Nitrite: NEGATIVE
PH: 6 (ref 5.0–8.0)
Protein, ur: NEGATIVE mg/dL
SPECIFIC GRAVITY, URINE: 1.022 (ref 1.005–1.030)

## 2016-09-30 LAB — WET PREP, GENITAL
SPERM: NONE SEEN
Trich, Wet Prep: NONE SEEN
YEAST WET PREP: NONE SEEN

## 2016-09-30 LAB — URINE MICROSCOPIC-ADD ON

## 2016-09-30 LAB — POC URINE PREG, ED: Preg Test, Ur: NEGATIVE

## 2016-09-30 MED ORDER — LIDOCAINE 5 % EX PTCH
1.0000 | MEDICATED_PATCH | CUTANEOUS | Status: DC
  Administered 2016-09-30: 1 via TRANSDERMAL
  Filled 2016-09-30: qty 1

## 2016-09-30 MED ORDER — METRONIDAZOLE 500 MG PO TABS
500.0000 mg | ORAL_TABLET | Freq: Once | ORAL | Status: AC
  Administered 2016-09-30: 500 mg via ORAL
  Filled 2016-09-30: qty 1

## 2016-09-30 MED ORDER — KETOROLAC TROMETHAMINE 60 MG/2ML IM SOLN
60.0000 mg | Freq: Once | INTRAMUSCULAR | Status: AC
  Administered 2016-09-30: 60 mg via INTRAMUSCULAR
  Filled 2016-09-30: qty 2

## 2016-09-30 MED ORDER — METRONIDAZOLE 500 MG PO TABS: 500.0000 mg | ORAL_TABLET | Freq: Two times a day (BID) | ORAL | 0 refills | Status: DC
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
## 2016-09-30 NOTE — ED Notes (Signed)
Pt ambulated to room from lobby without difficulty

## 2016-09-30 NOTE — ED Triage Notes (Signed)
Pt complaining of L sided sciatica. Pt states pain radiates from back to L buttocks and down leg. Pt also requesting pregnancy test and STD check. Pt states some vaginal discharge x 4 days.

## 2017-06-16 ENCOUNTER — Encounter (HOSPITAL_COMMUNITY): Payer: Self-pay | Admitting: Emergency Medicine

## 2017-06-16 ENCOUNTER — Emergency Department (HOSPITAL_COMMUNITY)
Admission: EM | Admit: 2017-06-16 | Discharge: 2017-06-16 | Discharge status: Against Medical Advice | Payer: Medicaid Other | Attending: Emergency Medicine | Admitting: Emergency Medicine

## 2017-06-16 ENCOUNTER — Emergency Department (HOSPITAL_COMMUNITY): Payer: Medicaid Other

## 2017-06-16 DIAGNOSIS — F17210 Nicotine dependence, cigarettes, uncomplicated: Secondary | ICD-10-CM | POA: Insufficient documentation

## 2017-06-16 DIAGNOSIS — R042 Hemoptysis: Secondary | ICD-10-CM | POA: Insufficient documentation

## 2017-06-16 NOTE — ED Provider Notes (Signed)
[facility]-EMERGENCY DEPT Provider Note   CSN: [id number] Arrival date & time: 06/16/17  1421  By signing my name below, I, [name], attest that this documentation has been prepared under the direction and in the presence of Tammy Safe PA-C.  Electronically Signed: [name], ED Scribe. 06/16/17. 3:39 PM.  History   Chief Complaint Chief Complaint  Patient presents with  . Hemoptysis    HPI HPI Comments: Tammy Rivers is a 26 y.o. female who presents to the Emergency Department complaining of persistent productive cough for the past 3 weeks with blood in sputum that started one week ago. Pt reports intermittent coughing with episodes of bloody sputum. She has used Vics vapor rub with no significant relief. Pt also tried Mucinex without relief. Her cough is worse at night. She reports associated central chest pain only while coughing. Pt is a former smoker, approximately 1ppd. Pt also bought a humidifier for her room when sleeping, she reports mild relief from this. No recent long distance travel. She does not work in a healthcare facility. No fever, chills, shortness of breath, rhinorrhea, congestion, ear pain, night sweats, weight loss. She does not have a current PCP. She does not have insurance.   The history is provided by the patient. No language interpreter was used.    Past Medical History:  Diagnosis Date  . Herpes   . PVC (premature ventricular contraction)     There are no active problems to display for this patient.   Past Surgical History:  Procedure Laterality Date  . extraction of wisdom teeth      OB History    No data available       Home Medications    Prior to Admission medications   Medication Sig Start Date End Date Taking? Authorizing Provider  aspirin-acetaminophen-caffeine (EXCEDRIN MIGRAINE) [phone] MG tablet Take 2 tablets by mouth every 6 (six) hours as needed for headache or migraine.   Yes [provider]    Pseudoephedrine-Guaifenesin (MUCINEX D MAX STRENGTH) [phone] MG TB12 Take 2 tablets by mouth daily as needed (cough).   Yes [provider]  cephALEXin (KEFLEX) 500 MG capsule Take 1 capsule (500 mg total) by mouth 4 (four) times daily. Patient not taking: Reported on 11/05/2014 06/22/14   Antony Madura, PA-C  cyclobenzaprine (FLEXERIL) 10 MG tablet Take 1 tablet (10 mg total) by mouth 2 (two) times daily as needed for muscle spasms. Patient not taking: Reported on 06/16/2017 09/07/16   Roxy Horseman, PA-C  HYDROcodone-acetaminophen (NORCO/VICODIN) 5-325 MG tablet Take 2 tablets by mouth every 4 (four) hours as needed. Patient not taking: Reported on 06/16/2017 04/19/16   Dowless, Lelon Mast Tripp, PA-C  ibuprofen (ADVIL,MOTRIN) 800 MG tablet Take 1 tablet (800 mg total) by mouth 3 (three) times daily. Patient not taking: Reported on 06/16/2017 03/05/16   Barrett Henle, PA-C  metroNIDAZOLE (FLAGYL) 500 MG tablet Take 1 tablet (500 mg total) by mouth 2 (two) times daily. One po bid x 7 days Patient not taking: Reported on 06/16/2017 09/30/16   Tomasita Crumble, MD  naproxen (NAPROSYN) 500 MG tablet Take 1 tablet (500 mg total) by mouth 2 (two) times daily. Patient not taking: Reported on 06/16/2017 04/19/16   Dowless, Lelon Mast Tripp, PA-C  oxymetazoline (AFRIN NASAL SPRAY) 0.05 % nasal spray Place 1 spray into both nostrils 2 (two) times daily. Patient not taking: Reported on 06/16/2017 03/05/16   Barrett Henle, PA-C    Family History History reviewed. No pertinent family history.  Social History Social History  Substance Use Topics  . Smoking status: Current Every Day Smoker    Packs/day: 0.00    Types: Cigarettes  . Smokeless tobacco: Never Used  . Alcohol use No     Comment: denies 10/29     Allergies   Patient has no known allergies.   Review of Systems Review of Systems  Constitutional: Negative for chills, diaphoresis and fever.  HENT: Positive for postnasal  drip. Negative for congestion, ear pain, rhinorrhea and sore throat.   Respiratory: Positive for cough.   Cardiovascular: Negative for chest pain.  Gastrointestinal: Negative for nausea and vomiting.  All other systems reviewed and are negative.    Physical Exam Updated Vital Signs BP (!) 110/55 (BP Location: Right Arm)   Pulse 64   Temp 98.1 F (36.7 C) (Oral)   Resp 20   Ht 5\' 2"  (1.575 m)   Wt 105.6 kg (232 lb 12.8 oz)   LMP 06/02/2017 (Approximate)   SpO2 100%   BMI 42.58 kg/m   Physical Exam  Constitutional: She is oriented to person, place, and time. She appears well-developed and well-nourished. No distress.  HENT:  Head: Normocephalic and atraumatic.  Right Ear: External ear and ear canal normal.  Left Ear: External ear and ear canal normal.  Nose: Nose normal.  Mouth/Throat: Uvula is midline, oropharynx is clear and moist and mucous membranes are normal.  Neck: Normal range of motion. Neck supple.  Cardiovascular: Normal rate, regular rhythm, normal heart sounds and intact distal pulses.   No murmur heard. Pulmonary/Chest: Effort normal and breath sounds normal. No respiratory distress. She has no wheezes. She exhibits no tenderness.  Abdominal: She exhibits no distension.  Musculoskeletal: She exhibits no deformity.  Lymphadenopathy:    She has no cervical adenopathy.  Neurological: She is alert and oriented to person, place, and time. No sensory deficit. She exhibits normal muscle tone.  Skin: Skin is warm and dry. She is not diaphoretic.  Psychiatric: She has a normal mood and affect. Her behavior is normal.  Nursing note and vitals reviewed.    ED Treatments / Results  DIAGNOSTIC STUDIES: Oxygen Saturation is 99% on RA, normal by my interpretation.  COORDINATION OF CARE: 3:36 PM-Discussed treatment plan with pt at bedside and pt agreed to plan.   Labs (all labs ordered are listed, but only abnormal results are displayed) Labs Reviewed - No data to  display  EKG  EKG Interpretation None       Radiology Dg Chest 2 View  Result Date: 06/16/2017 CLINICAL DATA:  Shortness of breath and chest pain EXAM: CHEST  2 VIEW COMPARISON:  Chest radiograph 07/26/2008 FINDINGS: The heart size and mediastinal contours are within normal limits. Both lungs are clear. The visualized skeletal structures are unremarkable. IMPRESSION: No active cardiopulmonary disease. Electronically Signed   By: Deatra Robinson M.D.   On: 06/16/2017 15:00    Procedures Procedures (including critical care time)  Medications Ordered in ED Medications - No data to display   Initial Impression / Assessment and Plan / ED Course  I have reviewed the triage vital signs and the nursing notes.  Pertinent labs & imaging results that were available during my care of the patient were reviewed by me and considered in my medical decision making (see chart for details).  Clinical Course as of Jun 17 125  Tue Jun 16, 2017  1628 Was informed the patient desires to leave. With RN in room I explained to patient  the risks of leaving before her evaluation is complete including, but not limited to disability that may be severe, long-term pain, organ failure or damage, stroke, heart attack, and death. I explained to her that these risks are not inclusive and that she may have other complications. I encouraged patient to return if her symptoms do not get better or if she is willing to have testing performed. Patient stated her understanding of these risks and that she accepts them and still wishes to sign out AMA as she needs to go pick up a family member.  [EH]    Clinical Course User Index [EH] Norman ClayHammond, Melannie Metzner W, PA-C    Tiffany KocherKari R Etienne presented to The emergency room for evaluation of hemoptysis. Chest x-ray was obtained without acute abnormalities.  Based on reported hemoptysis unable to apply perk rule for patient. D-dimer and pregnancy test were both ordered however patient chose to  sign out AMA before before these could be completed.  We discussed the nature and purpose, risks and benefits, as well as, the alternatives of treatment. Time was given to allow the opportunity to ask questions and consider their options, and after the discussion, the patient decided to refuse the offerred treatment. The patient was informed that refusal could lead to, but was not limited to, death, permanent disability, or severe pain.  Prior to refusing, I determined that the patient had the capacity to make their decision and understood the consequences of that decision. After refusal, I made every reasonable opportunity to treat them to the best of my ability.  The patient was notified that they may return to the emergency department at any time for further treatment.    Final Clinical Impressions(s) / ED Diagnoses   Final diagnoses:  Hemoptysis    New Prescriptions Discharge Medication List as of 06/16/2017  4:35 PM    I personally performed the services described in this documentation, which was scribed in my presence. The recorded information has been reviewed and is accurate.     Cristina GongHammond, Rashawna Scoles W, PA-C 06/17/17 0126    Rolland PorterJames, Mark, MD 06/18/17 1046

## 2017-06-16 NOTE — ED Triage Notes (Signed)
Pt c/o cough and constant chest pain worse with cough x 3 weeks, blood in clear sputum x 1 week. Self-treated with vapor rub and Mucinex without relief. No night sweats, weight loss.

## 2017-06-16 NOTE — Discharge Instructions (Signed)
Today you have made the informed decision to leave AGAINST MEDICAL ADVICE. The risks of doing this and been explained to you.  Please do not hesitate to return to the emergency room or follow-up with a doctor regarding your symptoms today.  Please take Ibuprofen (Advil, motrin) and Tylenol (acetaminophen) to relieve your pain.  You may take up to 600 MG (3 pills) of normal strength ibuprofen every 8 hours as needed.  In between doses of ibuprofen you make take tylenol, up to 1,000 mg (two extra strength pills).  Do not take more than 3,000 mg tylenol in a 24 hour period.  Please check all medication labels as many medications such as pain and cold medications may contain tylenol.  Do not drink alcohol while taking these medications.  Do not take other NSAID'S while taking ibuprofen (such as aleve or naproxen).  Please take ibuprofen with food to decrease stomach upset.

## 2017-06-16 NOTE — ED Notes (Signed)
[facility] to leave, because she needs to pick up her sister.

## 2017-07-17 ENCOUNTER — Encounter (HOSPITAL_COMMUNITY): Payer: Self-pay

## 2017-07-17 ENCOUNTER — Emergency Department (HOSPITAL_COMMUNITY)
Admission: EM | Admit: 2017-07-17 | Discharge: 2017-07-17 | Disposition: A | Discharge status: Home / Self Care | Payer: Self-pay | Attending: Emergency Medicine | Admitting: Emergency Medicine

## 2017-07-17 DIAGNOSIS — Z5321 Procedure and treatment not carried out due to patient leaving prior to being seen by health care provider: Secondary | ICD-10-CM | POA: Insufficient documentation

## 2017-07-17 DIAGNOSIS — M549 Dorsalgia, unspecified: Secondary | ICD-10-CM | POA: Insufficient documentation

## 2017-07-17 NOTE — ED Triage Notes (Signed)
Patient c/o right lower back pain that radiates down the right leg x 2 days. 

## 2019-11-11 HISTORY — PX: ABDOMINOPLASTY: SHX5355

## 2020-11-10 NOTE — L&D Delivery Note (Signed)
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 I was called to the room when she progressed 2+ station in the second stage of labor.  She pushed for 15/min.  She delivered a viable infant, cephalic and restituted to the ROA position over an intact perineum.  A nuchal cord x 2  was identified, loose and reduced. The baby was placed on maternal abdomen while initial step of NRP were perfmored (Dry, Stimulated, and warmed). Hat placed on baby for thermoregulation. Delayed cord clamping was performed for 2 minutes.  Cord double clamped and cut.  Cord cut by father. Apgar scores were 8 and 9. Prophylactic Pitocin was started in the third stage of labor for active management. The placenta delivered spontaneously, shultz, with a 3 vessel cord and was sent to LD.  Inspection revealed clitoral with bleeding. An examination of the vaginal vault and cervix was free from lacerations. The uterus was firm, bleeding stable.  The repair was done under lidocaine and epidural, pt was hemostatic post repair, blood loss was from laceration. Starting hgb was 12, pt asymptomatic.   Placenta and umbilical artery blood gas were not sent.  There were no complications during the procedure.  Mom and baby skin to skin following delivery. Left in stable condition.  Maternal Info: Anesthesia: Epidural Episiotomy: no Lacerations:  clitoral Suture Repair: 4.0 SH Est. Blood Loss (mL):   Newborn Info:  Baby Sex:  female Circumcision: in pt desired  APGAR (1 MIN): 8   APGAR (5 MINS): 9   APGAR (10 MINS):     Mom to postpartum.  Baby to Couplet care / Skin to Skin.   [place], [place], NP-C 04/06/21 1:37 AM

## 2021-01-18 ENCOUNTER — Inpatient Hospital Stay (HOSPITAL_COMMUNITY)
Admission: AD | Admit: 2021-01-18 | Discharge: 2021-01-18 | Disposition: A | Discharge status: Home / Self Care | Payer: Medicaid Other | Attending: Obstetrics & Gynecology | Admitting: Obstetrics & Gynecology

## 2021-01-18 ENCOUNTER — Encounter (HOSPITAL_COMMUNITY): Payer: Self-pay | Admitting: Obstetrics & Gynecology

## 2021-01-18 ENCOUNTER — Other Ambulatory Visit: Payer: Self-pay

## 2021-01-18 DIAGNOSIS — O98813 Other maternal infectious and parasitic diseases complicating pregnancy, third trimester: Secondary | ICD-10-CM

## 2021-01-18 DIAGNOSIS — O26893 Other specified pregnancy related conditions, third trimester: Secondary | ICD-10-CM

## 2021-01-18 DIAGNOSIS — O26892 Other specified pregnancy related conditions, second trimester: Secondary | ICD-10-CM

## 2021-01-18 DIAGNOSIS — R109 Unspecified abdominal pain: Secondary | ICD-10-CM

## 2021-01-18 DIAGNOSIS — L731 Pseudofolliculitis barbae: Secondary | ICD-10-CM | POA: Insufficient documentation

## 2021-01-18 DIAGNOSIS — O99713 Diseases of the skin and subcutaneous tissue complicating pregnancy, third trimester: Secondary | ICD-10-CM | POA: Diagnosis not present

## 2021-01-18 DIAGNOSIS — B373 Candidiasis of vulva and vagina: Secondary | ICD-10-CM

## 2021-01-18 DIAGNOSIS — Z9889 Other specified postprocedural states: Secondary | ICD-10-CM

## 2021-01-18 DIAGNOSIS — Z87891 Personal history of nicotine dependence: Secondary | ICD-10-CM | POA: Diagnosis not present

## 2021-01-18 DIAGNOSIS — Z3A28 28 weeks gestation of pregnancy: Secondary | ICD-10-CM

## 2021-01-18 DIAGNOSIS — O98819 Other maternal infectious and parasitic diseases complicating pregnancy, unspecified trimester: Secondary | ICD-10-CM

## 2021-01-18 DIAGNOSIS — Z3689 Encounter for other specified antenatal screening: Secondary | ICD-10-CM

## 2021-01-18 LAB — WET PREP, GENITAL
Clue Cells Wet Prep HPF POC: NONE SEEN
Sperm: NONE SEEN
Trich, Wet Prep: NONE SEEN

## 2021-01-18 LAB — URINALYSIS, ROUTINE W REFLEX MICROSCOPIC
Bacteria, UA: NONE SEEN
Bilirubin Urine: NEGATIVE
Glucose, UA: NEGATIVE mg/dL
Hgb urine dipstick: NEGATIVE
Ketones, ur: NEGATIVE mg/dL
Nitrite: NEGATIVE
Protein, ur: NEGATIVE mg/dL
Specific Gravity, Urine: 1.004 — ABNORMAL LOW (ref 1.005–1.030)
pH: 7 (ref 5.0–8.0)

## 2021-01-18 MED ORDER — CYCLOBENZAPRINE HCL 5 MG PO TABS
10.0000 mg | ORAL_TABLET | Freq: Once | ORAL | Status: AC
  Administered 2021-01-18: 10 mg via ORAL
  Filled 2021-01-18: qty 2

## 2021-01-18 MED ORDER — CYCLOBENZAPRINE HCL 10 MG PO TABS: 10.0000 mg | ORAL_TABLET | Freq: Two times a day (BID) | ORAL | 0 refills | Status: DC | PRN

## 2021-01-18 MED ORDER — ACETAMINOPHEN 325 MG PO TABS
650.0000 mg | ORAL_TABLET | Freq: Once | ORAL | Status: AC
  Administered 2021-01-18: 650 mg via ORAL
  Filled 2021-01-18: qty 2

## 2021-01-18 MED ORDER — TERCONAZOLE 0.4 % VA CREA: 1.0000 | TOPICAL_CREAM | Freq: Every day | VAGINAL | 0 refills | Status: DC

## 2021-01-18 NOTE — MAU Note (Signed)
PT SAYS VAG ITCHED IN FEB AND SCIATIC PAIN IN LEFT LEG .  WENT TO GYM 3 WEEKS - ALL STOPPED  YESTERDAY - NOTICE A LUMP BETWEEN VAG AND RECTUM.  SCIATIC PAIN RETURNED.  ALL WEEK HAS HAD IRREG UC'S AND ABD PAIN.  PNC WITH [facility]- [facility] IN FEB - DID NOT CALL OFFICE ABOUT ANY OF THIS.  HAS AN APPOINTMENT 01-23-21.

## 2021-01-18 NOTE — Discharge Instructions (Signed)
Abdominal Pain During Pregnancy Abdominal pain is common during pregnancy and has many possible causes. Some causes are more serious than others, and sometimes the cause is not known. Abdominal pain can be a sign that labor is starting. It can also be caused by normal growth of your baby causing stretching of muscles and ligaments during pregnancy. Always tell your health care provider if you have any abdominal pain. Follow these instructions at home:  Do not have sex or put anything in your vagina until your pain goes away completely.  Get plenty of rest until your pain improves.  Drink enough fluid to keep your urine pale yellow.  Take over-the-counter and prescription medicines only as told by your health care provider.  Keep all follow-up visits. This is important.   Contact a health care provider if:  Your pain continues or gets worse after resting.  You have lower abdominal pain that: ? Comes and goes at regular intervals. ? Spreads to your back. ? Is similar to menstrual cramps.  You have pain or burning when you urinate. Get help right away if:  You have a fever, chills, or shortness of breath.  You have vaginal bleeding.  You are leaking fluid or passing tissue from your vagina.  You have vomiting or diarrhea that lasts for more than 24 hours.  Your baby is moving less than usual.  You feel very weak or faint.  You develop severe pain in your upper abdomen. Summary  Abdominal pain is common during pregnancy and has many possible causes.  If you experience abdominal pain during pregnancy, tell your health care provider right away.  Follow your health care provider's home care instructions and keep all follow-up visits as told. This information is not intended to replace advice given to you by your health care provider. Make sure you discuss any questions you have with your health care provider. Document Revised: 07/10/2020 Document Reviewed: 07/10/2020 Elsevier  Patient Education  2021 Elsevier Inc.  

## 2021-01-18 NOTE — MAU Provider Note (Signed)
History     CSN: [id number]  Arrival date and time: 01/18/21 1855   None     Chief Complaint  Patient presents with  . Abdominal Pain  . Pelvic Pain  . Leg Pain   Tammy Rivers is a 30 y.o. G1P0 at [redacted]w[redacted]d who receives care at [facility].  She presents today for Abdominal Pain, Pelvic Pain, and Leg Pain. Patient reports she went to UC prior to arrival and was told to report for possible appendicitis. She reports RLQ pain that is intermittent and is "a sharp pain."  She reports it started "earlier this week" and rates it a 10/10 when it occurs.  She states it can last up to 30 minutes and improves when " I relax."  She reports she had an Abdominoplasty Aug 2021.  She also reports she also has left side sciatica pain that was relieved with walking on an incline.  Patient states she has not taken anything for her abdominal pain, but feels that these other factors may be contributing to it.  She also reports some vaginal itching and discharge that started "earlier this week."  She states it "started a month ago, but went away when I started going to the gym."  She reports a hemorrhoid or cyst that she noticed yesterday and it is painful.  +FM, -LOF, -VB.   OB History    Gravida  1   Para      Term      Preterm      AB      Living        SAB      IAB      Ectopic      Multiple      Live Births              Past Medical History:  Diagnosis Date  . Herpes   . PVC (premature ventricular contraction)     Past Surgical History:  Procedure Laterality Date  . extraction of wisdom teeth      History reviewed. No pertinent family history.  Social History   Tobacco Use  . Smoking status: Former Smoker    Packs/day: 0.00    Types: Cigarettes  . Smokeless tobacco: Never Used  Vaping Use  . Vaping Use: Never used  Substance Use Topics  . Alcohol use: No    Comment: denies 10/29  . Drug use: No    Allergies: No Known Allergies  Medications Prior to Admission   Medication Sig Dispense Refill Last Dose  . Ascorbic Acid (VITAMIN C) 100 MG tablet Take 100 mg by mouth daily.   01/18/2021 at Unknown time  . Prenatal Vit-Fe Fumarate-FA (MULTIVITAMIN-PRENATAL) 27-0.8 MG TABS tablet Take 1 tablet by mouth daily at 12 noon.   01/18/2021 at Unknown time  . aspirin-acetaminophen-caffeine (EXCEDRIN MIGRAINE) 250-250-65 MG tablet Take 2 tablets by mouth every 6 (six) hours as needed for headache or migraine.     . cephALEXin (KEFLEX) 500 MG capsule Take 1 capsule (500 mg total) by mouth 4 (four) times daily. (Patient not taking: Reported on 11/05/2014) 20 capsule 0   . cyclobenzaprine (FLEXERIL) 10 MG tablet Take 1 tablet (10 mg total) by mouth 2 (two) times daily as needed for muscle spasms. (Patient not taking: Reported on 06/16/2017) 20 tablet 0   . HYDROcodone-acetaminophen (NORCO/VICODIN) 5-325 MG tablet Take 2 tablets by mouth every 4 (four) hours as needed. (Patient not taking: Reported on 06/16/2017) 6 tablet 0   .  ibuprofen (ADVIL,MOTRIN) 800 MG tablet Take 1 tablet (800 mg total) by mouth 3 (three) times daily. (Patient not taking: Reported on 06/16/2017) 21 tablet 0   . metroNIDAZOLE (FLAGYL) 500 MG tablet Take 1 tablet (500 mg total) by mouth 2 (two) times daily. One po bid x 7 days (Patient not taking: Reported on 06/16/2017) 14 tablet 0   . naproxen (NAPROSYN) 500 MG tablet Take 1 tablet (500 mg total) by mouth 2 (two) times daily. (Patient not taking: Reported on 06/16/2017) 30 tablet 0   . oxymetazoline (AFRIN NASAL SPRAY) 0.05 % nasal spray Place 1 spray into both nostrils 2 (two) times daily. (Patient not taking: Reported on 06/16/2017) 30 mL 0   . Pseudoephedrine-Guaifenesin (MUCINEX D MAX STRENGTH) [phone] MG TB12 Take 2 tablets by mouth daily as needed (cough).       Review of Systems  Constitutional: Negative for chills and fever.  Respiratory: Negative for cough and shortness of breath.   Gastrointestinal: Positive for abdominal pain (RLQ ), constipation  and nausea (Yesterday, none currently). Negative for diarrhea and vomiting.  Genitourinary: Positive for dysuria (Burning this AM) and vaginal discharge. Negative for difficulty urinating, pelvic pain and vaginal bleeding (Thick, chunky, with green yellow tint).  Musculoskeletal: Positive for back pain.  Neurological: Negative for dizziness, light-headedness and headaches.   Physical Exam   Blood pressure 112/63, pulse 90, temperature 97.9 F (36.6 C), temperature source Oral, resp. rate 18, height 5\' 3"  (1.6 m), weight 97.7 kg, last menstrual period 07/02/2020.  Physical Exam Vitals reviewed. Exam conducted with a chaperone present.  Constitutional:      Appearance: She is well-developed.  HENT:     Head: Normocephalic and atraumatic.  Cardiovascular:     Rate and Rhythm: Normal rate and regular rhythm.  Pulmonary:     Effort: Pulmonary effort is normal. No respiratory distress.     Breath sounds: Normal breath sounds.  Abdominal:     General: Abdomen is flat. Bowel sounds are normal.     Tenderness: There is abdominal tenderness in the right lower quadrant.     Comments: Horizontal Scar noted from hip to hip.  Appears red, but not inflamed.  Mild tenderness with touch particular to right area, but no worsening with deep palpation or above/below incision.   Genitourinary:    Comments: Speculum Exam: -Normal External Genitalia: Non tender, small amt milky white discharge at introitus.  -Vaginal Vault: Pink mucosa with good rugae. Moderate amt greenish yellow curdy discharge -wet prep collected -Cervix:Pink, no lesions, cysts, or polyps.  Appears closed. No active bleeding from os-GC/CT collected -Bimanual Exam: Closed/Thick No tenderness in cul de sac  Rectum: No apparent hemorrhoids. 74mm papule noted on right gluteal fold near perineal.  Flesh colored.  Hair noted protruding from center. Nontender.   Skin:    General: Skin is warm and dry.  Neurological:     Mental Status: She  is alert.     Fetal Assessment 135 bpm, Mod Var, -Decels, +Accels Toco: No ctx graphed or palpated  MAU Course   Results for orders placed or performed during the hospital encounter of 01/18/21 (from the past 24 hour(s))  Wet prep, genital     Status: Abnormal   Collection Time: 01/18/21  8:32 PM   Specimen: Vaginal  Result Value Ref Range   Yeast Wet Prep HPF POC PRESENT (A) NONE SEEN   Trich, Wet Prep NONE SEEN NONE SEEN   Clue Cells Wet Prep HPF POC NONE SEEN  NONE SEEN   WBC, Wet Prep HPF POC MANY (A) NONE SEEN   Sperm NONE SEEN   Urinalysis, Routine w reflex microscopic Vaginal     Status: Abnormal   Collection Time: 01/18/21  8:35 PM  Result Value Ref Range   Color, Urine STRAW (A) YELLOW   APPearance CLEAR CLEAR   Specific Gravity, Urine 1.004 (L) 1.005 - 1.030   pH 7.0 5.0 - 8.0   Glucose, UA NEGATIVE NEGATIVE mg/dL   Hgb urine dipstick NEGATIVE NEGATIVE   Bilirubin Urine NEGATIVE NEGATIVE   Ketones, ur NEGATIVE NEGATIVE mg/dL   Protein, ur NEGATIVE NEGATIVE mg/dL   Nitrite NEGATIVE NEGATIVE   Leukocytes,Ua SMALL (A) NEGATIVE   RBC / HPF 0-5 0 - 5 RBC/hpf   WBC, UA 0-5 0 - 5 WBC/hpf   Bacteria, UA NONE SEEN NONE SEEN   Squamous Epithelial / LPF 0-5 0 - 5   No results found.  MDM PE Labs: UA, GC/CT, Wet Prep EFM Analgesic Muscle Relaxant Assessment and Plan  30 year old G1P0  SIUP at 28.4 weeks Cat I FT Abdominal Pain H/O Abdominoplasty Vaginal Discharge Ingrown Hair  -POC reviewed. -Exam performed and findings discussed. -Educated on how stretching of abdomen can cause pain and discomfort with h/o abdominoplasty.   -Discussed comfort techniques including rest, hydration, and warm compresses. -Reassured that findings are not c/w or suspicious for appendicitis.  Patient agrees stating she had a family member with appendicitis and symptoms were worse.  -Instructed to contact office or report to MAU for worsening of symptoms. -Informed that she does  not have hemorrhoid or cyst, but ingrown hair on gluteal fold.  Discussed relief measures including warm compresses and avoid shaving until pain resolves. -Informed that vaginal discharge is c/w yeast and will treat with Terazol 7. -Will collect GC/CT and Wet prep to r/o other infections. -Offered and accepts pain medication.  Will give flexeril and tylenol now. -NST Reactive. -Will monitor and reassess  Cherre Robins MSN, CNM 01/18/2021, 8:17 PM   Reassessment (9:22 PM)  -Wet prep returns positive for yeast. -Nurse states patient reports improvement in pain. -Will send prescriptions as discussed. -Will also send script for Flexeril for home usage as needed. -Encouraged to call primary ob or return to MAU if symptoms worsen or with the onset of new symptoms. -Discharged to home in improved condition.  Cherre Robins MSN, CNM Advanced Practice Provider, [facility]

## 2021-01-20 LAB — GC/CHLAMYDIA PROBE AMP (~~LOC~~) NOT AT ARMC
Chlamydia: NEGATIVE
Comment: NEGATIVE
Comment: NORMAL
Neisseria Gonorrhea: NEGATIVE

## 2021-03-18 LAB — OB RESULTS CONSOLE GBS: GBS: POSITIVE

## 2021-03-20 ENCOUNTER — Ambulatory Visit (INDEPENDENT_AMBULATORY_CARE_PROVIDER_SITE_OTHER): Payer: Self-pay | Admitting: Pediatrics

## 2021-03-20 ENCOUNTER — Other Ambulatory Visit: Payer: Self-pay

## 2021-03-20 DIAGNOSIS — Z7681 Expectant parent(s) prebirth pediatrician visit: Secondary | ICD-10-CM

## 2021-03-25 NOTE — Progress Notes (Signed)
Prenatal counseling for impending newborn done--  Reviewed current vaccine policy and answered all questions.  1st child, Currently 37 weeks, Current complications:  none, Prenatal care initiated:  2nd tri Z76.81

## 2021-03-27 ENCOUNTER — Encounter (HOSPITAL_COMMUNITY): Payer: Self-pay | Admitting: Obstetrics and Gynecology

## 2021-03-27 ENCOUNTER — Inpatient Hospital Stay (HOSPITAL_BASED_OUTPATIENT_CLINIC_OR_DEPARTMENT_OTHER): Payer: Medicaid Other

## 2021-03-27 ENCOUNTER — Inpatient Hospital Stay (HOSPITAL_COMMUNITY)
Admission: AD | Admit: 2021-03-27 | Discharge: 2021-03-27 | Disposition: A | Discharge status: Home / Self Care | Payer: Medicaid Other | Attending: Obstetrics and Gynecology | Admitting: Obstetrics and Gynecology

## 2021-03-27 ENCOUNTER — Other Ambulatory Visit: Payer: Self-pay

## 2021-03-27 DIAGNOSIS — O289 Unspecified abnormal findings on antenatal screening of mother: Secondary | ICD-10-CM

## 2021-03-27 DIAGNOSIS — Z3A38 38 weeks gestation of pregnancy: Secondary | ICD-10-CM | POA: Diagnosis not present

## 2021-03-27 DIAGNOSIS — Z3689 Encounter for other specified antenatal screening: Secondary | ICD-10-CM | POA: Insufficient documentation

## 2021-03-27 DIAGNOSIS — Z0371 Encounter for suspected problem with amniotic cavity and membrane ruled out: Secondary | ICD-10-CM

## 2021-03-27 DIAGNOSIS — Z3493 Encounter for supervision of normal pregnancy, unspecified, third trimester: Secondary | ICD-10-CM

## 2021-03-27 LAB — WET PREP, GENITAL
Clue Cells Wet Prep HPF POC: NONE SEEN
Sperm: NONE SEEN
Trich, Wet Prep: NONE SEEN
Yeast Wet Prep HPF POC: NONE SEEN

## 2021-03-27 LAB — POCT FERN TEST: POCT Fern Test: NEGATIVE

## 2021-03-27 NOTE — MAU Note (Signed)
Sent from office for prolonged monitoring, was non-reactive in the office.  Reports irreg contractions, some pressure. No bleeding.  Has been leaking small amt of clear watery fluid, had not mentioned it, she thought it was normal.

## 2021-03-27 NOTE — Discharge Instructions (Signed)

## 2021-03-27 NOTE — MAU Provider Note (Addendum)
History     CSN: [id number]  Arrival date and time: 03/27/21 1412   Event Date/Time   First Provider Initiated Contact with Patient 03/27/21 1505      Chief Complaint  Patient presents with  . non-reactive fetal tracing  . Rupture of Membranes   HPI Tammy Rivers is a 30 y.o. G1P0 at [redacted]w[redacted]d who presents to MAU from the [facility] office for prolonged monitoring. Patient's tracing was non-reactive in office.     Patient also complains of leaking of fluid. This is a new problem, onset a few days ago. Patient states she did not report it during her office appointment because she thought it was normal.   Patient denies abdominal pain, vaginal bleeding, decreased fetal movement, fever, falls, or recent illness.   Patient receives care with [facility].   OB History    Gravida  1   Para      Term      Preterm      AB      Living        SAB      IAB      Ectopic      Multiple      Live Births              Past Medical History:  Diagnosis Date  . Herpes   . PVC (premature ventricular contraction)     Past Surgical History:  Procedure Laterality Date  . ABDOMINOPLASTY  2021  . extraction of wisdom teeth      History reviewed. No pertinent family history.  Social History   Tobacco Use  . Smoking status: Former Smoker    Packs/day: 0.00    Types: Cigarettes  . Smokeless tobacco: Never Used  Vaping Use  . Vaping Use: Never used  Substance Use Topics  . Alcohol use: No    Comment: denies 10/29  . Drug use: No    Allergies: No Known Allergies  Medications Prior to Admission  Medication Sig Dispense Refill Last Dose  . amoxicillin (AMOXIL) 500 MG tablet Take 500 mg by mouth 2 (two) times daily.   03/26/2021 at Unknown time  . Omega-3 Fatty Acids (FISH OIL) 1000 MG CAPS Take by mouth.   03/26/2021 at Unknown time  . Prenatal Vit-Fe Fumarate-FA (MULTIVITAMIN-PRENATAL) 27-0.8 MG TABS tablet Take 1 tablet by mouth daily at 12 noon.   03/26/2021 at  Unknown time  . valACYclovir (VALTREX) 500 MG tablet Take 500 mg by mouth 2 (two) times daily.   03/26/2021 at Unknown time  . Zinc Sulfate (ZINC 15 PO) Take by mouth.   03/26/2021 at Unknown time  . Ascorbic Acid (VITAMIN C) 100 MG tablet Take 100 mg by mouth daily.     Marland Kitchen aspirin-acetaminophen-caffeine (EXCEDRIN MIGRAINE) 250-250-65 MG tablet Take 2 tablets by mouth every 6 (six) hours as needed for headache or migraine.     . cyclobenzaprine (FLEXERIL) 10 MG tablet Take 1 tablet (10 mg total) by mouth 2 (two) times daily as needed for muscle spasms. 20 tablet 0   . terconazole (TERAZOL 7) 0.4 % vaginal cream Place 1 applicator vaginally at bedtime. 45 g 0     Review of Systems  Gastrointestinal: Negative for abdominal pain.  Genitourinary: Positive for vaginal discharge.  All other systems reviewed and are negative.  Physical Exam   Blood pressure 127/66, pulse 90, temperature 98.2 F (36.8 C), temperature source Oral, resp. rate 17, last menstrual period 07/02/2020, SpO2 97 %.  Physical Exam Vitals and nursing note reviewed. Exam conducted with a chaperone present.  Constitutional:      Appearance: Normal appearance. She is not ill-appearing.  Cardiovascular:     Rate and Rhythm: Normal rate.     Pulses: Normal pulses.  Pulmonary:     Effort: Pulmonary effort is normal.     Breath sounds: Normal breath sounds.  Abdominal:     Comments: Gravid  Genitourinary:    Comments: Pelvic exam: External genitalia normal, vaginal walls pink and well rugated, cervix visually closed, no lesions noted.   Neurological:     Mental Status: She is alert and oriented to person, place, and time.  Psychiatric:        Mood and Affect: Mood normal.        Behavior: Behavior normal.        Thought Content: Thought content normal.        Judgment: Judgment normal.     MAU Course  Procedures: sterile speculum exam  --Report received from [facility], CNM prior to patient arrival. NRNST in office  then 10 x 10 accels after juice and crackers. --Reactive tracing in MAU. Baseline 140, mod var, + 15 x 15 accels, no decels --Toco: rare contraction --Cervix visually closed on SSE, confirmed closed with digital exam --Negative pooling, negative fern  Patient Vitals for the past 24 hrs:  BP Temp Temp src Pulse Resp SpO2  03/27/21 1647 132/77 -- -- 79 15 100 %  03/27/21 1521 127/66 -- -- 90 -- 97 %  03/27/21 1449 130/81 -- -- 88 17 98 %  03/27/21 1442 -- 98.2 F (36.8 C) Oral -- -- 97 %    Results for orders placed or performed during the hospital encounter of 03/27/21 (from the past 24 hour(s))  Wet prep, genital     Status: Abnormal   Collection Time: 03/27/21  3:35 PM   Specimen: Cervix  Result Value Ref Range   Yeast Wet Prep HPF POC NONE SEEN NONE SEEN   Trich, Wet Prep NONE SEEN NONE SEEN   Clue Cells Wet Prep HPF POC NONE SEEN NONE SEEN   WBC, Wet Prep HPF POC FEW (A) NONE SEEN   Sperm NONE SEEN   Fern Test     Status: None   Collection Time: 03/27/21  3:37 PM  Result Value Ref Range   POCT Fern Test Negative = intact amniotic membranes     Assessment and Plan  --30 y.o. G1P0 at [redacted]w[redacted]d  --Reactive tracing --Closed cervix, intact amniotic sac --BPP 8/8 --Discharge home in stable condition  Calvert Cantor, CNM 03/27/2021, 7:46 PM

## 2021-04-04 ENCOUNTER — Other Ambulatory Visit: Payer: Self-pay

## 2021-04-04 ENCOUNTER — Encounter (HOSPITAL_COMMUNITY): Payer: Self-pay | Admitting: Obstetrics and Gynecology

## 2021-04-04 ENCOUNTER — Inpatient Hospital Stay (EMERGENCY_DEPARTMENT_HOSPITAL)
Admission: AD | Admit: 2021-04-04 | Discharge: 2021-04-04 | Disposition: A | Discharge status: Home / Self Care | Payer: Medicaid Other | Source: Home / Self Care | Attending: Obstetrics and Gynecology | Admitting: Obstetrics and Gynecology

## 2021-04-04 DIAGNOSIS — Z3A00 Weeks of gestation of pregnancy not specified: Secondary | ICD-10-CM | POA: Diagnosis not present

## 2021-04-04 DIAGNOSIS — O471 False labor at or after 37 completed weeks of gestation: Secondary | ICD-10-CM | POA: Insufficient documentation

## 2021-04-04 DIAGNOSIS — O479 False labor, unspecified: Secondary | ICD-10-CM | POA: Diagnosis not present

## 2021-04-04 DIAGNOSIS — Z3A39 39 weeks gestation of pregnancy: Secondary | ICD-10-CM | POA: Insufficient documentation

## 2021-04-04 NOTE — Discharge Instructions (Signed)
Rosen's Emergency Medicine: Concepts and Clinical Practice (9th ed., pp. 2296- 2312). Elsevier.">  Braxton Hicks Contractions Contractions of the uterus can occur throughout pregnancy, but they are not always a sign that you are in labor. You may have practice contractions called Braxton Hicks contractions. These false labor contractions are sometimes confused with true labor. What are Braxton Hicks contractions? Braxton Hicks contractions are tightening movements that occur in the muscles of the uterus before labor. Unlike true labor contractions, these contractions do not result in opening (dilation) and thinning of the cervix. Toward the end of pregnancy (32-34 weeks), Braxton Hicks contractions can happen more often and may become stronger. These contractions are sometimes difficult to tell apart from true labor because they can be very uncomfortable. You should not feel embarrassed if you go to the hospital with false labor. Sometimes, the only way to tell if you are in true labor is for your health care provider to look for changes in the cervix. The health care provider will do a physical exam and may monitor your contractions. If you are not in true labor, the exam should show that your cervix is not dilating and your water has not broken. If there are no other health problems associated with your pregnancy, it is completely safe for you to be sent home with false labor. You may continue to have Braxton Hicks contractions until you go into true labor. How to tell the difference between true labor and false labor True labor  Contractions last 30-70 seconds.  Contractions become very regular.  Discomfort is usually felt in the top of the uterus, and it spreads to the lower abdomen and low back.  Contractions do not go away with walking.  Contractions usually become more intense and increase in frequency.  The cervix dilates and gets thinner. False labor  Contractions are usually shorter  and not as strong as true labor contractions.  Contractions are usually irregular.  Contractions are often felt in the front of the lower abdomen and in the groin.  Contractions may go away when you walk around or change positions while lying down.  Contractions get weaker and are shorter-lasting as time goes on.  The cervix usually does not dilate or become thin. Follow these instructions at home:  Take over-the-counter and prescription medicines only as told by your health care provider.  Keep up with your usual exercises and follow other instructions from your health care provider.  Eat and drink lightly if you think you are going into labor.  If Braxton Hicks contractions are making you uncomfortable: ? Change your position from lying down or resting to walking, or change from walking to resting. ? Sit and rest in a tub of warm water. ? Drink enough fluid to keep your urine pale yellow. Dehydration may cause these contractions. ? Do slow and deep breathing several times an hour.  Keep all follow-up prenatal visits as told by your health care provider. This is important.   Contact a health care provider if:  You have a fever.  You have continuous pain in your abdomen. Get help right away if:  Your contractions become stronger, more regular, and closer together.  You have fluid leaking or gushing from your vagina.  You pass blood-tinged mucus (bloody show).  You have bleeding from your vagina.  You have low back pain that you never had before.  You feel your baby's head pushing down and causing pelvic pressure.  Your baby is not moving inside   you as much as it used to. Summary  Contractions that occur before labor are called Braxton Hicks contractions, false labor, or practice contractions.  Braxton Hicks contractions are usually shorter, weaker, farther apart, and less regular than true labor contractions. True labor contractions usually become progressively  stronger and regular, and they become more frequent.  Manage discomfort from Braxton Hicks contractions by changing position, resting in a warm bath, drinking plenty of water, or practicing deep breathing. This information is not intended to replace advice given to you by your health care provider. Make sure you discuss any questions you have with your health care provider. Document Revised: 10/09/2017 Document Reviewed: 03/12/2017 Elsevier Patient Education  2021 [employer].   Fetal Movement Counts Patient Name: ________________________________________________ Patient Due Date: ____________________  What is a fetal movement count? A fetal movement count is the number of times that you feel your baby move during a certain amount of time. This may also be called a fetal kick count. A fetal movement count is recommended for every pregnant woman. You may be asked to start counting fetal movements as early as week 28 of your pregnancy. Pay attention to when your baby is most active. You may notice your baby's sleep and wake cycles. You may also notice things that make your baby move more. You should do a fetal movement count:  When your baby is normally most active.  At the same time each day. A good time to count movements is while you are resting, after having something to eat and drink. How do I count fetal movements? 1. Find a quiet, comfortable area. Sit, or lie down on your side. 2. Write down the date, the start time and stop time, and the number of movements that you felt between those two times. Take this information with you to your health care visits. 3. Write down your start time when you feel the first movement. 4. Count kicks, flutters, swishes, rolls, and jabs. You should feel at least 10 movements. 5. You may stop counting after you have felt 10 movements, or if you have been counting for 2 hours. Write down the stop time. 6. If you do not feel 10 movements in 2 hours, contact  your health care provider for further instructions. Your health care provider may want to do additional tests to assess your baby's well-being. Contact a health care provider if:  You feel fewer than 10 movements in 2 hours.  Your baby is not moving like he or she usually does. Date: ____________ Start time: ____________ Stop time: ____________ Movements: ____________ Date: ____________ Start time: ____________ Stop time: ____________ Movements: ____________ Date: ____________ Start time: ____________ Stop time: ____________ Movements: ____________ Date: ____________ Start time: ____________ Stop time: ____________ Movements: ____________ Date: ____________ Start time: ____________ Stop time: ____________ Movements: ____________ Date: ____________ Start time: ____________ Stop time: ____________ Movements: ____________ Date: ____________ Start time: ____________ Stop time: ____________ Movements: ____________ Date: ____________ Start time: ____________ Stop time: ____________ Movements: ____________ Date: ____________ Start time: ____________ Stop time: ____________ Movements: ____________ This information is not intended to replace advice given to you by your health care provider. Make sure you discuss any questions you have with your health care provider. Document Revised: 06/16/2019 Document Reviewed: 06/16/2019 Elsevier Patient Education  2021 Else[employer] Inc.  

## 2021-04-04 NOTE — MAU Provider Note (Signed)
S: Patient is here for RN labor evaluation. Strip, vital signs, & chart Reviewed   O:  Vitals:   04/04/21 0049 04/04/21 0051 04/04/21 0219  BP:  117/72 126/78  Pulse:  82 88  Resp:  20 16  SpO2:  99%   Weight: 108.9 kg    Height: 5\' 2"  (1.575 m)     No results found for this or any previous visit (from the past 24 hour(s)).  Dilation: 1 Effacement (%): 80 Cervical Position: Posterior Station: -2 Presentation: Vertex Exam by:: JO BArham RNC   FHR: 130 bpm, moderate variability, +accels, no decels UC: irregular   A: 1. Braxton Hick's contraction      P:  Cervix unchanged. RN to discharge home in stable condition with return precautions & fetal kick counts   [phone], MSN, CNM 04/04/21 3:07 AM

## 2021-04-04 NOTE — Progress Notes (Signed)
Written and verbal d/c instructions given and understanding voiced. 

## 2021-04-04 NOTE — MAU Note (Signed)
Ctxs for about an hour. Some bloody show.1cm at office on Weds

## 2021-04-05 ENCOUNTER — Inpatient Hospital Stay (HOSPITAL_COMMUNITY)
Admission: AD | Admit: 2021-04-05 | Discharge: 2021-04-05 | Disposition: A | Discharge status: Home / Self Care | Payer: Medicaid Other | Source: Home / Self Care | Attending: Obstetrics and Gynecology | Admitting: Obstetrics and Gynecology

## 2021-04-05 ENCOUNTER — Inpatient Hospital Stay (HOSPITAL_COMMUNITY): Payer: Medicaid Other | Admitting: Anesthesiology

## 2021-04-05 ENCOUNTER — Inpatient Hospital Stay (HOSPITAL_COMMUNITY)
Admission: AD | Admit: 2021-04-05 | Discharge: 2021-04-07 | DRG: 806 | Disposition: A | Discharge status: Home / Self Care | Payer: Medicaid Other | Attending: Obstetrics & Gynecology | Admitting: Obstetrics & Gynecology

## 2021-04-05 ENCOUNTER — Other Ambulatory Visit: Payer: Self-pay

## 2021-04-05 ENCOUNTER — Encounter (HOSPITAL_COMMUNITY): Payer: Self-pay | Admitting: Obstetrics & Gynecology

## 2021-04-05 ENCOUNTER — Encounter (HOSPITAL_COMMUNITY): Payer: Self-pay | Admitting: Obstetrics and Gynecology

## 2021-04-05 DIAGNOSIS — Z8619 Personal history of other infectious and parasitic diseases: Secondary | ICD-10-CM | POA: Diagnosis present

## 2021-04-05 DIAGNOSIS — O26893 Other specified pregnancy related conditions, third trimester: Secondary | ICD-10-CM | POA: Diagnosis present

## 2021-04-05 DIAGNOSIS — O9081 Anemia of the puerperium: Secondary | ICD-10-CM | POA: Diagnosis not present

## 2021-04-05 DIAGNOSIS — Z20822 Contact with and (suspected) exposure to covid-19: Secondary | ICD-10-CM | POA: Diagnosis present

## 2021-04-05 DIAGNOSIS — Z3A39 39 weeks gestation of pregnancy: Secondary | ICD-10-CM | POA: Insufficient documentation

## 2021-04-05 DIAGNOSIS — A6000 Herpesviral infection of urogenital system, unspecified: Secondary | ICD-10-CM | POA: Diagnosis present

## 2021-04-05 DIAGNOSIS — Z87891 Personal history of nicotine dependence: Secondary | ICD-10-CM | POA: Diagnosis not present

## 2021-04-05 DIAGNOSIS — O99214 Obesity complicating childbirth: Secondary | ICD-10-CM | POA: Diagnosis present

## 2021-04-05 DIAGNOSIS — D62 Acute posthemorrhagic anemia: Secondary | ICD-10-CM | POA: Diagnosis not present

## 2021-04-05 DIAGNOSIS — O479 False labor, unspecified: Secondary | ICD-10-CM

## 2021-04-05 DIAGNOSIS — O99824 Streptococcus B carrier state complicating childbirth: Secondary | ICD-10-CM | POA: Diagnosis present

## 2021-04-05 DIAGNOSIS — O9832 Other infections with a predominantly sexual mode of transmission complicating childbirth: Secondary | ICD-10-CM | POA: Diagnosis present

## 2021-04-05 DIAGNOSIS — Z880 Allergy status to penicillin: Secondary | ICD-10-CM | POA: Diagnosis not present

## 2021-04-05 DIAGNOSIS — O471 False labor at or after 37 completed weeks of gestation: Secondary | ICD-10-CM | POA: Insufficient documentation

## 2021-04-05 DIAGNOSIS — B951 Streptococcus, group B, as the cause of diseases classified elsewhere: Secondary | ICD-10-CM | POA: Diagnosis present

## 2021-04-05 LAB — CBC
HCT: 35.3 % — ABNORMAL LOW (ref 36.0–46.0)
Hemoglobin: 12 g/dL (ref 12.0–15.0)
MCH: 29.3 pg (ref 26.0–34.0)
MCHC: 34 g/dL (ref 30.0–36.0)
MCV: 86.3 fL (ref 80.0–100.0)
Platelets: 167 10*3/uL (ref 150–400)
RBC: 4.09 MIL/uL (ref 3.87–5.11)
RDW: 13.1 % (ref 11.5–15.5)
WBC: 14.4 10*3/uL — ABNORMAL HIGH (ref 4.0–10.5)
nRBC: 0 % (ref 0.0–0.2)

## 2021-04-05 LAB — RESP PANEL BY RT-PCR (FLU A&B, COVID) ARPGX2
Influenza A by PCR: NEGATIVE
Influenza B by PCR: NEGATIVE
SARS Coronavirus 2 by RT PCR: NEGATIVE

## 2021-04-05 LAB — TYPE AND SCREEN
ABO/RH(D): O POS
Antibody Screen: NEGATIVE

## 2021-04-05 MED ORDER — SOD CITRATE-CITRIC ACID 500-334 MG/5ML PO SOLN: 30.0000 mL | ORAL | Status: DC | PRN

## 2021-04-05 MED ORDER — OXYCODONE-ACETAMINOPHEN 5-325 MG PO TABS: 1.0000 | ORAL_TABLET | ORAL | Status: DC | PRN

## 2021-04-05 MED ORDER — LACTATED RINGERS IV SOLN
500.0000 mL | Freq: Once | INTRAVENOUS | Status: AC
  Administered 2021-04-05: 500 mL via INTRAVENOUS

## 2021-04-05 MED ORDER — FENTANYL-BUPIVACAINE-NACL 0.5-0.125-0.9 MG/250ML-% EP SOLN
12.0000 mL/h | EPIDURAL | Status: DC | PRN
  Administered 2021-04-05: 12 mL/h via EPIDURAL
  Filled 2021-04-05: qty 250

## 2021-04-05 MED ORDER — OXYTOCIN BOLUS FROM INFUSION
333.0000 mL | Freq: Once | INTRAVENOUS | Status: AC
  Administered 2021-04-06: 333 mL via INTRAVENOUS

## 2021-04-05 MED ORDER — FENTANYL CITRATE (PF) 100 MCG/2ML IJ SOLN: 50.0000 ug | INTRAMUSCULAR | Status: DC | PRN

## 2021-04-05 MED ORDER — PHENYLEPHRINE 40 MCG/ML (10ML) SYRINGE FOR IV PUSH (FOR BLOOD PRESSURE SUPPORT): 80.0000 ug | PREFILLED_SYRINGE | INTRAVENOUS | Status: DC | PRN

## 2021-04-05 MED ORDER — OXYTOCIN-SODIUM CHLORIDE 30-0.9 UT/500ML-% IV SOLN
1.0000 m[IU]/min | INTRAVENOUS | Status: DC
  Administered 2021-04-05: 2 m[IU]/min via INTRAVENOUS

## 2021-04-05 MED ORDER — OXYCODONE-ACETAMINOPHEN 5-325 MG PO TABS
2.0000 | ORAL_TABLET | ORAL | Status: DC | PRN
Start: 2021-04-05 — End: 2021-04-06

## 2021-04-05 MED ORDER — DIPHENHYDRAMINE HCL 50 MG/ML IJ SOLN: 12.5000 mg | INTRAMUSCULAR | Status: DC | PRN

## 2021-04-05 MED ORDER — LIDOCAINE-EPINEPHRINE (PF) 2 %-1:200000 IJ SOLN
INTRAMUSCULAR | Status: DC | PRN
Start: 2021-04-05 — End: 2021-04-06
  Administered 2021-04-05: 4 mL via EPIDURAL

## 2021-04-05 MED ORDER — PENICILLIN G POT IN DEXTROSE 60000 UNIT/ML IV SOLN
3.0000 10*6.[IU] | INTRAVENOUS | Status: DC
  Administered 2021-04-05 (×2): 3 10*6.[IU] via INTRAVENOUS
  Filled 2021-04-05 (×2): qty 50

## 2021-04-05 MED ORDER — ONDANSETRON HCL 4 MG/2ML IJ SOLN
4.0000 mg | Freq: Four times a day (QID) | INTRAMUSCULAR | Status: DC | PRN
  Administered 2021-04-05: 4 mg via INTRAVENOUS
  Filled 2021-04-05: qty 2

## 2021-04-05 MED ORDER — LACTATED RINGERS IV SOLN: INTRAVENOUS | Status: DC

## 2021-04-05 MED ORDER — ACETAMINOPHEN 325 MG PO TABS: 650.0000 mg | ORAL_TABLET | ORAL | Status: DC | PRN

## 2021-04-05 MED ORDER — SODIUM CHLORIDE 0.9 % IV SOLN
5.0000 10*6.[IU] | Freq: Once | INTRAVENOUS | Status: AC
  Administered 2021-04-05: 5 10*6.[IU] via INTRAVENOUS
  Filled 2021-04-05: qty 5

## 2021-04-05 MED ORDER — LACTATED RINGERS IV SOLN: 500.0000 mL | INTRAVENOUS | Status: DC | PRN

## 2021-04-05 MED ORDER — EPHEDRINE 5 MG/ML INJ: 10.0000 mg | INTRAVENOUS | Status: DC | PRN

## 2021-04-05 MED ORDER — LIDOCAINE HCL (PF) 1 % IJ SOLN
30.0000 mL | INTRAMUSCULAR | Status: AC | PRN
  Administered 2021-04-06: 30 mL via SUBCUTANEOUS
  Filled 2021-04-05: qty 30

## 2021-04-05 MED ORDER — TERBUTALINE SULFATE 1 MG/ML IJ SOLN: 0.2500 mg | Freq: Once | INTRAMUSCULAR | Status: DC | PRN

## 2021-04-05 MED ORDER — OXYTOCIN-SODIUM CHLORIDE 30-0.9 UT/500ML-% IV SOLN
2.5000 [IU]/h | INTRAVENOUS | Status: DC
  Filled 2021-04-05: qty 500

## 2021-04-05 NOTE — Anesthesia Preprocedure Evaluation (Addendum)
Anesthesia Evaluation  Patient identified by MRN, date of birth, ID band Patient awake    Reviewed: Allergy & Precautions, NPO status , Patient's Chart, lab work & pertinent test results  Airway Mallampati: III  TM Distance: >3 FB Neck ROM: Full    Dental no notable dental hx.    Pulmonary neg pulmonary ROS, former smoker,    Pulmonary exam normal breath sounds clear to auscultation       Cardiovascular negative cardio ROS Normal cardiovascular exam Rhythm:Regular Rate:Normal     Neuro/Psych negative neurological ROS  negative psych ROS   GI/Hepatic negative GI ROS, Neg liver ROS,   Endo/Other  Morbid obesity (BMI 44)  Renal/GU negative Renal ROS  negative genitourinary   Musculoskeletal negative musculoskeletal ROS (+)   Abdominal   Peds  Hematology negative hematology ROS (+)   Anesthesia Other Findings Presenting in labor  Reproductive/Obstetrics (+) Pregnancy                             Anesthesia Physical Anesthesia Plan  ASA: III  Anesthesia Plan: Epidural   Post-op Pain Management:    Induction:   PONV Risk Score and Plan: Treatment may vary due to age or medical condition  Airway Management Planned: Natural Airway  Additional Equipment:   Intra-op Plan:   Post-operative Plan:   Informed Consent: I have reviewed the patients History and Physical, chart, labs and discussed the procedure including the risks, benefits and alternatives for the proposed anesthesia with the patient or authorized representative who has indicated his/her understanding and acceptance.       Plan Discussed with: Anesthesiologist  Anesthesia Plan Comments: (Patient identified. Risks, benefits, options discussed with patient including but not limited to bleeding, infection, nerve damage, paralysis, failed block, incomplete pain control, headache, blood pressure changes, nausea, vomiting,  reactions to medication, itching, and post partum back pain. Confirmed with bedside nurse the patient's most recent platelet count. Confirmed with the patient that they are not taking any anticoagulation, have any bleeding history or any family history of bleeding disorders. Patient expressed understanding and wishes to proceed. All questions were answered. )        Anesthesia Quick Evaluation

## 2021-04-05 NOTE — H&P (Signed)
Tammy Rivers is a 30 y.o. 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 LR female desires in pt circ. Pt endorse + Fm. Denies vaginal leakage. Denies vaginal bleeding.  Patient Active Problem List   Diagnosis Date Noted  . Normal labor 04/05/2021     Active Ambulatory Problems    Diagnosis Date Noted  . No Active Ambulatory Problems   Resolved Ambulatory Problems    Diagnosis Date Noted  . No Resolved Ambulatory Problems   Past Medical History:  Diagnosis Date  . Herpes   . PVC (premature ventricular contraction)       Medications Prior to Admission  Medication Sig Dispense Refill Last Dose  . cyclobenzaprine (FLEXERIL) 10 MG tablet Take 1 tablet (10 mg total) by mouth 2 (two) times daily as needed for muscle spasms. 20 tablet 0 04/05/2021 at Unknown time  . diphenhydramine-acetaminophen (TYLENOL PM) 25-500 MG TABS tablet Take 1 tablet by mouth at bedtime as needed.   04/05/2021 at Unknown time  . Prenatal Vit-Fe Fumarate-FA (MULTIVITAMIN-PRENATAL) 27-0.8 MG TABS tablet Take 1 tablet by mouth daily at 12 noon.   04/04/2021 at Unknown time  . valACYclovir (VALTREX) 500 MG tablet Take 500 mg by mouth daily.   04/04/2021 at Unknown time  . Zinc Sulfate (ZINC 15 PO) Take by mouth.   04/04/2021 at Unknown time  . amoxicillin (AMOXIL) 500 MG tablet Take 500 mg by mouth 2 (two) times daily.     . Ascorbic Acid (VITAMIN C) 100 MG tablet Take 100 mg by mouth daily.     Marland Kitchen aspirin-acetaminophen-caffeine (EXCEDRIN MIGRAINE) 250-250-65 MG tablet Take 2 tablets by mouth every 6 (six) hours as needed for headache or migraine.     . cyanocobalamin 100 MCG tablet Take 100 mcg by mouth daily.     . Omega-3 Fatty Acids (FISH OIL) 1000 MG CAPS Take by mouth.     . terconazole (TERAZOL 7) 0.4 % vaginal cream Place 1 applicator vaginally at bedtime. 45 g 0     Past Medical History:  Diagnosis Date  .  Herpes   . PVC (premature ventricular contraction)      No current facility-administered medications on file prior to encounter.   Current Outpatient Medications on File Prior to Encounter  Medication Sig Dispense Refill  . cyclobenzaprine (FLEXERIL) 10 MG tablet Take 1 tablet (10 mg total) by mouth 2 (two) times daily as needed for muscle spasms. 20 tablet 0  . diphenhydramine-acetaminophen (TYLENOL PM) 25-500 MG TABS tablet Take 1 tablet by mouth at bedtime as needed.    . Prenatal Vit-Fe Fumarate-FA (MULTIVITAMIN-PRENATAL) 27-0.8 MG TABS tablet Take 1 tablet by mouth daily at 12 noon.    . valACYclovir (VALTREX) 500 MG tablet Take 500 mg by mouth daily.    . Zinc Sulfate (ZINC 15 PO) Take by mouth.    Marland Kitchen amoxicillin (AMOXIL) 500 MG tablet Take 500 mg by mouth 2 (two) times daily.    . Ascorbic Acid (VITAMIN C) 100 MG tablet Take 100 mg by mouth daily.    Marland Kitchen aspirin-acetaminophen-caffeine (EXCEDRIN MIGRAINE) 250-250-65 MG tablet Take 2 tablets by mouth every 6 (six) hours as needed for headache or migraine.    . cyanocobalamin 100 MCG tablet Take 100 mcg by mouth daily.    . Omega-3 Fatty Acids (FISH OIL) 1000 MG CAPS Take by mouth.    . terconazole (TERAZOL 7) 0.4 %  vaginal cream Place 1 applicator vaginally at bedtime. 45 g 0     No Known Allergies  History of present pregnancy: Pt Info/Preference:  Screening/Consents:  Labs:   EDD: Estimated Date of Delivery: 04/08/21  Establised: Patient's last menstrual period was 07/02/2020.  Anatomy Scan: Date: 12/06/2020 Placenta Location: anterior placenta measuring 3.4 cm from internal OS no f/u.  Genetic Screen: Panoroma:LR female AFP:  First Tri: Quad:  Office: [facility]            First PNV: 14.2 weeks Blood Type  O+  Language: english Last PNV: 39.2 wg Rhogam  N/A  Flu Vaccine:  declined   Antibody  Neg  TDaP vaccine declined   GTT: Early: 5.1 Third Trimester: 97  Feeding Plan: breast BTL: no Rubella:  Immune  Contraception: ??? VBAC: no  RPR:   NR  Circumcision: In pt desired   HBsAg:  Neg/ HCV neg  Pediatrician:  ???   HIV:   Neg  Prenatal Classes: no Additional [place]: no GBS: Positive/-- (05/09 0000)(For PCN allergy, check sensitivities)       Chlamydia: neg    MFM Referral/Consult:  GC: neg  Support Person: partner   PAP: ???  Pain Management: epidural Neonatologist Referral:  Hgb Electrophoresis:  AA  Birth Plan: DCC   Hgb NOB: 11.7    28W: 11   OB History    Gravida  1   Para      Term      Preterm      AB      Living        SAB      IAB      Ectopic      Multiple      Live Births             Past Medical History:  Diagnosis Date  . Herpes   . PVC (premature ventricular contraction)    Past Surgical History:  Procedure Laterality Date  . ABDOMINOPLASTY  2021  . extraction of wisdom teeth     Family History: family history is not on file. Social History:  reports that she has quit smoking. Her smoking use included cigarettes. She smoked 0.00 packs per day. She has never used smokeless tobacco. She reports that she does not drink alcohol and does not use drugs.   Prenatal Transfer Tool  Maternal Diabetes: No Genetic Screening: Normal Maternal Ultrasounds/Referrals: Normal Fetal Ultrasounds or other Referrals:  None Maternal Substance Abuse:  No Significant Maternal Medications:  Meds include: Other:  Valtrex Significant Maternal Lab Results: Group B Strep positive and Other: HSV+  ROS:  Review of Systems  Constitutional: Negative.   HENT: Negative.   Eyes: Negative.   Respiratory: Negative.   Cardiovascular: Negative.   Gastrointestinal: Positive for abdominal pain.  Genitourinary: Negative.   Musculoskeletal: Positive for back pain.  Skin: Negative.   Neurological: Negative.   Endo/Heme/Allergies: Negative.   Psychiatric/Behavioral: Negative.      Physical Exam: BP 125/71 (BP Location: Left Arm)   Pulse (!) 104   Temp 98.3 F (36.8 C) (Oral)   Resp 18   LMP  07/02/2020   SpO2 99%   Physical Exam Vitals and nursing note reviewed. Exam conducted with a chaperone present.  Constitutional:      Appearance: Normal appearance.  HENT:     Head: Normocephalic.     Mouth/Throat:     Mouth: Mucous membranes are moist.  Eyes:     Conjunctiva/sclera: Conjunctivae normal.  Cardiovascular:     Rate and Rhythm: Normal rate and regular rhythm.     Pulses: Normal pulses.     Heart sounds: Normal heart sounds.  Pulmonary:     Effort: Pulmonary effort is normal.     Breath sounds: Normal breath sounds.  Abdominal:     General: Bowel sounds are normal.  Genitourinary:    General: Normal vulva.     Rectum: Normal.     Comments: Speculum exam WNL, no bleeding, no lesion noted, uterus gravida equal to dates, pelvis adequate for vaginal delivery  Musculoskeletal:        General: Normal range of motion.     Cervical back: Normal range of motion and neck supple.  Skin:    General: Skin is warm.     Capillary Refill: Capillary refill takes less than 2 seconds.  Neurological:     General: No focal deficit present.     Mental Status: She is alert.  Psychiatric:        Mood and Affect: Mood normal.      NST: FHR baseline 145 bpm, Variability: moderate, Accelerations:present, Decelerations:  Absent= Cat 1/Reactive UC:   regular, every 4-6 minutes SVE:   Dilation: 4 Effacement (%): 90 Station: -2 Exam by:: Tlytle RN, vertex verified by fetal sutures.  Leopold's: Position vertex, EFW 8lbs via leopold's.   Labs: No results found for this or any previous visit (from the past 24 hour(s)).  Imaging:  US MFM Fetal BPP Wo Non Stress  Result Date: 03/27/2021 ----------------------------------------------------------------------  OBSTETRICS REPORT                       (Signed Final 03/27/2021 05:28 pm) ---------------------------------------------------------------------- Patient Info  ID #:       [id number]                          D.O.B.:  25-Nov-1990 (29  yrs)  Name:       Tiffany KocherKARI R Andrzejewski                  Visit Date: 03/27/2021 04:07 pm ---------------------------------------------------------------------- Performed By  Attending:        Lin Landsmanorenthian Booker      [facility]                    MD  Performed By:     Percell BostonHeather Waken          [facility] ---------------------------------------------------------------------- Orders  #  Description                           Code        Ordered By  1  US MFM FETAL BPP WO NON               [id number]    SAMANTHA     STRESS  WEINHOLD ----------------------------------------------------------------------  #  Order #                     Accession #                Episode #  1  [id number]                   [id number]                 [id number] ---------------------------------------------------------------------- Indications  [redacted] weeks gestation of pregnancy                Z3A.38  Non-reactive NST                               O28.9 ---------------------------------------------------------------------- Fetal Evaluation  Num Of Fetuses:         1  Fetal Heart Rate(bpm):  130  Cardiac Activity:       Observed  Presentation:           Cephalic  Amniotic Fluid  AFI FV:      Within normal limits  AFI Sum(cm)     %Tile       Largest Pocket(cm)  18.9            75          7  RUQ(cm)       RLQ(cm)       LUQ(cm)        LLQ(cm)  3             3.3           5.6            7 ---------------------------------------------------------------------- Biophysical Evaluation  Amniotic F.V:   Within normal limits       F. Tone:        Observed  F. Movement:    Observed                   Score:          8/8  F. Breathing:   Observed ---------------------------------------------------------------------- OB History  Gravidity:    1         Term:   0        Prem:   0        SAB:   0  TOP:          0        Ectopic:  0        Living: 0 ---------------------------------------------------------------------- Gestational Age  LMP:           38w 2d        Date:  07/02/20                 EDD:   04/08/21  Best:          38w 2d     Det. By:  LMP  (07/02/20)          EDD:   04/08/21 ---------------------------------------------------------------------- Impression  Antenatal testing performed given Non-reactive NST  The biophysical profile was 8/8 with good fetal movement and  amniotic fluid volume. ---------------------------------------------------------------------- Recommendations  Clinical correlation recommended. ----------------------------------------------------------------------               Lin Landsman, MD Electronically Signed Final Report   03/27/2021 05:28 pm ----------------------------------------------------------------------   MAU Course: Orders Placed This Encounter  Procedures  .  Resp Panel by RT-PCR (Flu A&B, Covid) Nasopharyngeal Swab  . Airborne and Contact precautions  . Admit to Inpatient (patient's expected length of stay will be greater than 2 midnights or inpatient only procedure)   Meds ordered this encounter  Medications  . DISCONTD: lactated ringers infusion    Assessment/Plan: ELYSHA DAW is a 30 y.o. 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 LR female desires in pt circ. Pt endorse + Fm. Denies vaginal leakage. Denies vaginal bleeding.  FWB: Cat 1 Fetal Tracing.   Plan: Admit to Birthing Suite per consult with Dr Sallye Ober Routine [facility] orders Pain med/epidural prn PCN G for GBS prophylaxis  HSV: Monitor for lesions.  Anticipate labor progression   Dale [place] NP-C, CNM, MSN 04/05/2021, 1:27 PM

## 2021-04-05 NOTE — MAU Note (Signed)
Provider checked pt for HSV lesions. None noted on exam.

## 2021-04-05 NOTE — MAU Note (Addendum)
PT returns to MAU for labor eval. States cntx are 6 mins apart and have been going on since 7pm last night. Was 1 cm at last SVE earlier today. Denies LOF, endorses some pink blood and fetal movement. PT took Tylenol PM and melatonin but was not able to sleep. Also states she feels the first signs of an HSV outbreak. RN check did not reveal any lesions; provider will be notified.

## 2021-04-05 NOTE — Progress Notes (Signed)
Labor Progress Note  Tammy Rivers is a 30 y.o. 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 LR female desires in pt circ.  Subjective: Pt comfortable post epidural placement, has been able to get sleep. Discussed AROM versus pitocin, R/B/A, pt verbalized ok to AROM now then start pitocin if indicated later.  Patient Active Problem List   Diagnosis Date Noted  . Normal labor 04/05/2021  . Normal labor and delivery 04/05/2021   Objective: BP 122/66   Pulse 93   Temp 98.1 F (36.7 C) (Axillary)   Resp 17   Ht 5\' 2"  (1.575 m)   Wt 108.9 kg   LMP 07/02/2020   SpO2 96%   BMI 43.91 kg/m  I/O last 3 completed shifts: In: 94.3 [Other:2.6; IV Piggyback:91.7] Out: -  No intake/output data recorded. NST: FHR baseline 140 bpm, Variability: moderate, Accelerations:present, Decelerations:  Absent= Cat 1/Reactive CTX:  regular, every 5-6 minutes Uterus gravid, soft non tender, moderate to palpate with contractions.  SVE:  Dilation: 6 Effacement (%): 90 Station: -1 Exam by:: [facility], CNM Pitocin at (N/A) mUn/min AROM, clear tolerated well.   Assessment:  Tammy Rivers is a 30 y.o. 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 LR female desires in pt circ. Pt progressing now in active labor after AROM and comfortable with epidural.  Patient Active Problem List   Diagnosis Date Noted  . Normal labor 04/05/2021  . Normal labor and delivery 04/05/2021   NICHD: Category 1  Membranes:  AROM, clear @ 2009 on 5/27, no s/s of infection  Induction:    Cytotec xN/A  Foley Bulb: N/A due to dilation  Pitocin - N/A pt cxt   Pain management:               IV pain management: x None             Epidural placement:  at 1516 on 5/27  GBS Positive  Abx: PCN @ 1457,  1828  Plan: Continue labor plan Continuous monitoring Rest Frequent position changes to facilitate fetal rotation and descent. Will reassess with cervical exam at 4 hours or earlier if necessary May start pitocin per protocol 2x2 in 4 hours if no cervical change Anticipate labor progression and vaginal delivery.   [facility] aware of plan and verbalized agreement.   [facility], [facility], NP-C, CNM, MSN 04/05/2021. 8:50 PM

## 2021-04-05 NOTE — Discharge Instructions (Signed)
Rosen's Emergency Medicine: Concepts and Clinical Practice (9th ed., pp. 2296- 2312). Elsevier.">  Braxton Hicks Contractions Contractions of the uterus can occur throughout pregnancy, but they are not always a sign that you are in labor. You may have practice contractions called Braxton Hicks contractions. These false labor contractions are sometimes confused with true labor. What are Braxton Hicks contractions? Braxton Hicks contractions are tightening movements that occur in the muscles of the uterus before labor. Unlike true labor contractions, these contractions do not result in opening (dilation) and thinning of the cervix. Toward the end of pregnancy (32-34 weeks), Braxton Hicks contractions can happen more often and may become stronger. These contractions are sometimes difficult to tell apart from true labor because they can be very uncomfortable. You should not feel embarrassed if you go to the hospital with false labor. Sometimes, the only way to tell if you are in true labor is for your health care provider to look for changes in the cervix. The health care provider will do a physical exam and may monitor your contractions. If you are not in true labor, the exam should show that your cervix is not dilating and your water has not broken. If there are no other health problems associated with your pregnancy, it is completely safe for you to be sent home with false labor. You may continue to have Braxton Hicks contractions until you go into true labor. How to tell the difference between true labor and false labor True labor  Contractions last 30-70 seconds.  Contractions become very regular.  Discomfort is usually felt in the top of the uterus, and it spreads to the lower abdomen and low back.  Contractions do not go away with walking.  Contractions usually become more intense and increase in frequency.  The cervix dilates and gets thinner. False labor  Contractions are usually shorter  and not as strong as true labor contractions.  Contractions are usually irregular.  Contractions are often felt in the front of the lower abdomen and in the groin.  Contractions may go away when you walk around or change positions while lying down.  Contractions get weaker and are shorter-lasting as time goes on.  The cervix usually does not dilate or become thin. Follow these instructions at home:  Take over-the-counter and prescription medicines only as told by your health care provider.  Keep up with your usual exercises and follow other instructions from your health care provider.  Eat and drink lightly if you think you are going into labor.  If Braxton Hicks contractions are making you uncomfortable: ? Change your position from lying down or resting to walking, or change from walking to resting. ? Sit and rest in a tub of warm water. ? Drink enough fluid to keep your urine pale yellow. Dehydration may cause these contractions. ? Do slow and deep breathing several times an hour.  Keep all follow-up prenatal visits as told by your health care provider. This is important.   Contact a health care provider if:  You have a fever.  You have continuous pain in your abdomen. Get help right away if:  Your contractions become stronger, more regular, and closer together.  You have fluid leaking or gushing from your vagina.  You pass blood-tinged mucus (bloody show).  You have bleeding from your vagina.  You have low back pain that you never had before.  You feel your baby's head pushing down and causing pelvic pressure.  Your baby is not moving inside   you as much as it used to. Summary  Contractions that occur before labor are called Braxton Hicks contractions, false labor, or practice contractions.  Braxton Hicks contractions are usually shorter, weaker, farther apart, and less regular than true labor contractions. True labor contractions usually become progressively  stronger and regular, and they become more frequent.  Manage discomfort from [facility] contractions by changing position, resting in a warm bath, drinking plenty of water, or practicing deep breathing. This information is not intended to replace advice given to you by your health care provider. Make sure you discuss any questions you have with your health care provider. Document Revised: 10/09/2017 Document Reviewed: 03/12/2017 Elsevier Patient Education  2021 [employer].  Labor Induction Labor induction is when steps are taken to cause a pregnant woman to begin the labor process. Most women go into labor on their own between 37 weeks and 42 weeks of pregnancy. When this does not happen, or when there is a medical need for labor to begin, steps may be taken to induce, or bring on, labor. Labor induction causes a pregnant woman's uterus to contract. It also causes the cervix to soften (ripen), open (dilate), and thin out. Usually, labor is not induced before 39 weeks of pregnancy unless there is a medical reason to do so. When is labor induction considered? Labor induction may be right for you if:  Your pregnancy lasts longer than 41 to 42 weeks.  Your placenta is separating from your uterus (placental abruption).  You have a rupture of membranes and your labor does not begin.  You have health problems, like diabetes or high blood pressure (preeclampsia) during your pregnancy.  Your baby has stopped growing or does not have enough amniotic fluid. Before labor induction begins, your health care provider will consider the following factors:  Your medical condition and the baby's condition.  How many weeks you have been pregnant.  How mature the baby's lungs are.  The condition of your cervix.  The position of the baby.  The size of your birth canal. Tell a health care provider about:  Any allergies you have.  All medicines you are taking, including vitamins, herbs, eye  drops, creams, and over-the-counter medicines.  Any problems you or your family members have had with anesthetic medicines.  Any surgeries you have had.  Any blood disorders you have.  Any medical conditions you have. What are the risks? Generally, this is a safe procedure. However, problems may occur, including:  Failed induction.  Changes in fetal heart rate, such as being too high, too low, or irregular (erratic).  Infection in the mother or the baby.  Increased risk of having a cesarean delivery.  Breaking off (abruption) of the placenta from the uterus. This is rare.  Rupture of the uterus. This is very rare.  Your baby could fail to get enough blood flow or oxygen. This can be life-threatening. When induction is needed for medical reasons, the benefits generally outweigh the risks. What happens during the procedure? During the procedure, your health care provider will use one of these methods to induce labor:  Stripping the membranes. In this method, the amniotic sac tissue is gently separated from the cervix. This causes the following to happen: ? Your cervix stretches, which in turn causes the release of prostaglandins. ? Prostaglandins induce labor and cause the uterus to contract. ? This procedure is often done in an office visit. You will be sent home to wait for contractions to begin.  Prostaglandin medicine. This medicine starts contractions and causes the cervix to dilate and ripen. This can be taken by mouth (orally) or by being inserted into the vagina (suppository).  Inserting a small, thin tube (catheter) with a balloon into the vagina and then expanding the balloon with water to dilate the cervix.  Breaking the water. In this method, a small instrument is used to make a small hole in the amniotic sac. This eventually causes the amniotic sac to break. Contractions should begin within a few hours.  Medicine to trigger or strengthen contractions. This medicine  is given through an IV that is inserted into a vein in your arm. This procedure may vary among health care providers and hospitals.   Where to find more information  March of Dimes: www.marchofdimes.org  The American College of Obstetricians and Gynecologists: www.acog.org Summary  Labor induction causes a pregnant woman's uterus to contract. It also causes the cervix to soften (ripen), open (dilate), and thin out.  Labor is usually not induced before 39 weeks of pregnancy unless there is a medical reason to do so.  When induction is needed for medical reasons, the benefits generally outweigh the risks.  Talk with your health care provider about which methods of labor induction are right for you. This information is not intended to replace advice given to you by your health care provider. Make sure you discuss any questions you have with your health care provider. Document Revised: 08/09/2020 Document Reviewed: 08/09/2020 Elsevier Patient Education  2021 Elsevier Inc.  

## 2021-04-05 NOTE — MAU Note (Signed)
..  Tammy Rivers is a 30 y.o. at [redacted]w[redacted]d here in MAU reporting: Contractions since 7pm that are now 3-5 minutes apart. Last SVE: 1cm +FM. Denies vaginal bleeding or leaking of fluid  Pain score: 10/10 Vitals:   04/05/21 0259 04/05/21 0305  BP:  123/79  Pulse:  79  Resp:  17  Temp:  98.3 F (36.8 C)  SpO2: 98%

## 2021-04-05 NOTE — Anesthesia Procedure Notes (Signed)
Epidural Patient location during procedure: OR Start time: 04/05/2021 2:55 PM End time: 04/05/2021 3:05 PM  Staffing Anesthesiologist: Elmer Picker, MD Performed: anesthesiologist   Preanesthetic Checklist Completed: patient identified, IV checked, risks and benefits discussed, monitors and equipment checked, pre-op evaluation and timeout performed  Epidural Patient position: sitting Prep: DuraPrep and site prepped and draped Patient monitoring: continuous pulse ox, blood pressure, heart rate and cardiac monitor Approach: midline Location: L3-L4 Injection technique: LOR air  Needle:  Needle type: Tuohy  Needle gauge: 17 G Needle length: 9 cm Needle insertion depth: 7 cm Catheter type: closed end flexible Catheter size: 19 Gauge Catheter at skin depth: 12 cm Test dose: negative  Assessment Sensory level: T8 Events: blood not aspirated, injection not painful, no injection resistance, no paresthesia and negative IV test  Additional Notes Patient identified. Risks/Benefits/Options discussed with patient including but not limited to bleeding, infection, nerve damage, paralysis, failed block, incomplete pain control, headache, blood pressure changes, nausea, vomiting, reactions to medication both or allergic, itching and postpartum back pain. Confirmed with bedside nurse the patient's most recent platelet count. Confirmed with patient that they are not currently taking any anticoagulation, have any bleeding history or any family history of bleeding disorders. Patient expressed understanding and wished to proceed. All questions were answered. Sterile technique was used throughout the entire procedure. Please see nursing notes for vital signs. Test dose was given through epidural catheter and negative prior to continuing to dose epidural or start infusion. Warning signs of high block given to the patient including shortness of breath, tingling/numbness in hands, complete motor block,  or any concerning symptoms with instructions to call for help. Patient was given instructions on fall risk and not to get out of bed. All questions and concerns addressed with instructions to call with any issues or inadequate analgesia.  Reason for block:procedure for pain

## 2021-04-06 ENCOUNTER — Encounter (HOSPITAL_COMMUNITY): Payer: Self-pay | Admitting: Obstetrics & Gynecology

## 2021-04-06 DIAGNOSIS — Z8619 Personal history of other infectious and parasitic diseases: Secondary | ICD-10-CM | POA: Diagnosis present

## 2021-04-06 DIAGNOSIS — B951 Streptococcus, group B, as the cause of diseases classified elsewhere: Secondary | ICD-10-CM | POA: Diagnosis present

## 2021-04-06 LAB — CBC
HCT: 29.4 % — ABNORMAL LOW (ref 36.0–46.0)
Hemoglobin: 9.8 g/dL — ABNORMAL LOW (ref 12.0–15.0)
MCH: 28.9 pg (ref 26.0–34.0)
MCHC: 33.3 g/dL (ref 30.0–36.0)
MCV: 86.7 fL (ref 80.0–100.0)
Platelets: 161 10*3/uL (ref 150–400)
RBC: 3.39 MIL/uL — ABNORMAL LOW (ref 3.87–5.11)
RDW: 13.3 % (ref 11.5–15.5)
WBC: 17.5 10*3/uL — ABNORMAL HIGH (ref 4.0–10.5)
nRBC: 0 % (ref 0.0–0.2)

## 2021-04-06 LAB — RPR: RPR Ser Ql: NONREACTIVE

## 2021-04-06 MED ORDER — ONDANSETRON HCL 4 MG/2ML IJ SOLN: 4.0000 mg | INTRAMUSCULAR | Status: DC | PRN

## 2021-04-06 MED ORDER — OXYCODONE HCL 5 MG PO TABS
10.0000 mg | ORAL_TABLET | ORAL | Status: DC | PRN
Start: 2021-04-06 — End: 2021-04-07
  Administered 2021-04-07: 10 mg via ORAL
  Filled 2021-04-06: qty 2

## 2021-04-06 MED ORDER — ACETAMINOPHEN 325 MG PO TABS: 650.0000 mg | ORAL_TABLET | ORAL | Status: DC | PRN

## 2021-04-06 MED ORDER — SIMETHICONE 80 MG PO CHEW: 80.0000 mg | CHEWABLE_TABLET | ORAL | Status: DC | PRN

## 2021-04-06 MED ORDER — WITCH HAZEL-GLYCERIN EX PADS
1.0000 "application " | MEDICATED_PAD | CUTANEOUS | Status: DC | PRN
  Administered 2021-04-06: 1 via TOPICAL

## 2021-04-06 MED ORDER — COCONUT OIL OIL: 1.0000 "application " | TOPICAL_OIL | Status: DC | PRN

## 2021-04-06 MED ORDER — ERYTHROMYCIN 5 MG/GM OP OINT
TOPICAL_OINTMENT | OPHTHALMIC | Status: AC
  Filled 2021-04-06: qty 1

## 2021-04-06 MED ORDER — IBUPROFEN 600 MG PO TABS
600.0000 mg | ORAL_TABLET | Freq: Four times a day (QID) | ORAL | Status: DC
  Administered 2021-04-06 – 2021-04-07 (×7): 600 mg via ORAL
  Filled 2021-04-06 (×7): qty 1

## 2021-04-06 MED ORDER — ONDANSETRON HCL 4 MG PO TABS: 4.0000 mg | ORAL_TABLET | ORAL | Status: DC | PRN

## 2021-04-06 MED ORDER — DIBUCAINE (PERIANAL) 1 % EX OINT
1.0000 | TOPICAL_OINTMENT | CUTANEOUS | Status: DC | PRN
Start: 2021-04-06 — End: 2021-04-07

## 2021-04-06 MED ORDER — PRENATAL MULTIVITAMIN CH
1.0000 | ORAL_TABLET | Freq: Every day | ORAL | Status: DC
  Administered 2021-04-06 – 2021-04-07 (×2): 1 via ORAL
  Filled 2021-04-06 (×2): qty 1

## 2021-04-06 MED ORDER — OXYCODONE HCL 5 MG PO TABS
5.0000 mg | ORAL_TABLET | ORAL | Status: DC | PRN
  Administered 2021-04-06: 5 mg via ORAL
  Filled 2021-04-06: qty 1

## 2021-04-06 MED ORDER — TETANUS-DIPHTH-ACELL PERTUSSIS 5-2.5-18.5 LF-MCG/0.5 IM SUSY: 0.5000 mL | PREFILLED_SYRINGE | Freq: Once | INTRAMUSCULAR | Status: DC

## 2021-04-06 MED ORDER — DIPHENHYDRAMINE HCL 25 MG PO CAPS: 25.0000 mg | ORAL_CAPSULE | Freq: Four times a day (QID) | ORAL | Status: DC | PRN

## 2021-04-06 MED ORDER — SENNOSIDES-DOCUSATE SODIUM 8.6-50 MG PO TABS
2.0000 | ORAL_TABLET | Freq: Every day | ORAL | Status: DC
  Administered 2021-04-07: 2 via ORAL
  Filled 2021-04-06: qty 2

## 2021-04-06 MED ORDER — ZOLPIDEM TARTRATE 5 MG PO TABS: 5.0000 mg | ORAL_TABLET | Freq: Every evening | ORAL | Status: DC | PRN

## 2021-04-06 MED ORDER — BENZOCAINE-MENTHOL 20-0.5 % EX AERO
1.0000 | INHALATION_SPRAY | CUTANEOUS | Status: DC | PRN
Start: 2021-04-06 — End: 2021-04-07
  Administered 2021-04-06: 1 via TOPICAL
  Filled 2021-04-06: qty 56

## 2021-04-06 NOTE — Lactation Note (Signed)
This note was copied from a baby's chart. Lactation Consultation Note  Patient Name: Tammy Rivers ACZYS'A Date: 04/06/2021 Reason for consult: L&D Initial assessment Age:30 hours P1, term female infant. Mom latched infant on her left breast using the football hold position, infant was off and on breast and little congested. Mom will continue to work on latching infant at the breast on MBU, mom knows to ask RN and [facility] for assistance with latch.  LC discussed breastfeeding infant according to primal cues: licking , tasting, smacking, rooting, hands and fist in mouth, rooting, BF infant STS. LC discussed infant's input and output with mom.   Maternal Data Has patient been taught Hand Expression?: Yes Does the patient have breastfeeding experience prior to this delivery?: No  Feeding Mother's Current Feeding Choice: Breast Milk  LATCH Score Latch: Repeated attempts needed to sustain latch, nipple held in mouth throughout feeding, stimulation needed to elicit sucking reflex.  Audible Swallowing: A few with stimulation  Type of Nipple: Everted at rest and after stimulation  Comfort (Breast/Nipple): Soft / non-tender  Hold (Positioning): Assistance needed to correctly position infant at breast and maintain latch.  LATCH Score: 7   Lactation Tools Discussed/Used    Interventions Interventions: Assisted with latch;Skin to skin;Breast compression;Adjust position;Support pillows;Position options;Education  Discharge Pump: Personal WIC Program: No  Consult Status Consult Status: Follow-up Date: 04/06/21 Follow-up type: In-patient    Danelle Earthly 04/06/2021, 2:15 AM

## 2021-04-06 NOTE — Lactation Note (Signed)
This note was copied from a baby's chart. Lactation Consultation Note  Patient Name: Tammy Rivers BZJIR'C Date: 04/06/2021 Reason for consult: L&D Initial assessment Age:29 hours   Initial Lactation Consult:  RN in room with mother; offered to assist with latching, however, mother not interested at this time stating that the RN had attempted.  Baby was swaddled and asleep in the bassinet.  Suggested mother get some rest and to call for assistance when baby shows feeding cues.  Father present.   Maternal Data Has patient been taught Hand Expression?: Yes Does the patient have breastfeeding experience prior to this delivery?: No  Feeding Mother's Current Feeding Choice: Breast Milk  LATCH Score Latch: Repeated attempts needed to sustain latch, nipple held in mouth throughout feeding, stimulation needed to elicit sucking reflex.  Audible Swallowing: A few with stimulation  Type of Nipple: Everted at rest and after stimulation  Comfort (Breast/Nipple): Soft / non-tender  Hold (Positioning): Assistance needed to correctly position infant at breast and maintain latch.  LATCH Score: 7   Lactation Tools Discussed/Used    Interventions Interventions: Assisted with latch;Skin to skin;Breast compression;Adjust position;Support pillows;Position options;Education  Discharge Pump: Personal WIC Program: No  Consult Status Consult Status: Follow-up Date: 04/06/21 Follow-up type: In-patient    Aston Lieske R Aizley Stenseth 04/06/2021, 5:07 AM

## 2021-04-06 NOTE — Anesthesia Postprocedure Evaluation (Signed)
Anesthesia Post Note  Patient: Tammy Rivers  Procedure(s) Performed: AN AD HOC LABOR EPIDURAL     Patient location during evaluation: Mother Baby Anesthesia Type: Epidural Level of consciousness: awake and alert Pain management: pain level controlled Vital Signs Assessment: post-procedure vital signs reviewed and stable Respiratory status: spontaneous breathing, nonlabored ventilation and respiratory function stable Cardiovascular status: stable Postop Assessment: no headache, no backache and epidural receding Anesthetic complications: no   No complications documented.  Last Vitals:  Vitals:   04/06/21 0319 04/06/21 0420  BP: 119/76 125/75  Pulse: (!) 108 (!) 106  Resp: 16 16  Temp: 36.7 C 36.9 C  SpO2: 98% 100%    Last Pain:  Vitals:   04/06/21 0506  TempSrc:   PainSc: 6    Pain Goal:                   Rina Adney

## 2021-04-06 NOTE — Lactation Note (Addendum)
This note was copied from a baby's chart. Lactation Consultation Note  Patient Name: Tammy Rivers CNOBS'J Date: 04/06/2021   Age:30 hours  Mom is a P1 who reports + breast changes w/pregnancy. Mom's nipples are intact (although openings from previous nipple piercings noted). Mom requested assist with latching.  Mom laid on her side & was assisted with latch. Infant did not consistently maintain latch, but some swallows were noted during infant's time at the breast. Mom was comfortable with latch & said this was the best he had done so far.   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 When crying, a frenulum is visible, but elevation of tongue seemed WNL.  Mom has a Lansinoh pump for home use, but also brought it with her. RN had provided her with a hand pump; Mom's nipple diameter suggests she needs a size 27 flange, which I provided.   Mom was shown phone # on back of breastfeeding brochure to reach [place] for post-discharge questions.  Lurline Hare [facility] 04/06/2021, 7:55 AM

## 2021-04-07 ENCOUNTER — Encounter (HOSPITAL_COMMUNITY): Payer: Self-pay | Admitting: Obstetrics & Gynecology

## 2021-04-07 DIAGNOSIS — D62 Acute posthemorrhagic anemia: Secondary | ICD-10-CM | POA: Diagnosis not present

## 2021-04-07 MED ORDER — IBUPROFEN 600 MG PO TABS: 600.0000 mg | ORAL_TABLET | Freq: Four times a day (QID) | ORAL | 0 refills | Status: DC

## 2021-04-07 MED ORDER — POLYSACCHARIDE IRON COMPLEX 150 MG PO CAPS
150.0000 mg | ORAL_CAPSULE | Freq: Every day | ORAL | Status: DC
  Administered 2021-04-07: 150 mg via ORAL
  Filled 2021-04-07: qty 1

## 2021-04-07 MED ORDER — POLYSACCHARIDE IRON COMPLEX 150 MG PO CAPS: 150.0000 mg | ORAL_CAPSULE | Freq: Every day | ORAL | 2 refills | Status: AC

## 2021-04-07 NOTE — Discharge Summary (Signed)
SVD OB Discharge Summary     Patient Name: Tammy Rivers DOB: 01/13/1991 MRN: [id number]  Date of admission: 04/05/2021 Delivering MD: Dale Wamsutter  Date of delivery: 04/06/2021 Type of delivery: SVD  Newborn Data: Sex: Baby Female Circumcision: in pt circ done today Live born female  Birth Weight: 8 lb 2.2 oz (3691 g) APGAR: 8, 9  Newborn Delivery   Birth date/time: 04/06/2021 00:53:00 Delivery type: Vaginal, Spontaneous      Feeding: breast Infant being discharge to home with mother in stable condition.   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 Tammy Rivers is a 30 y.o. female, G2P1011, who presents at [redacted]w[redacted]d weeks gestation. The patient has been followed at  [facility]  Her pregnancy has been complicated by:  Patient Active Problem List   Diagnosis Date Noted  . Acute blood loss anemia 04/07/2021  . SVD (spontaneous vaginal delivery) 04/06/2021  . Normal postpartum course 04/06/2021  . Positive GBS test 04/06/2021  . 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 Normal labor 04/05/2021  . Normal labor and delivery 04/05/2021     Active Ambulatory Problems    Diagnosis Date Noted  . No Active Ambulatory  Problems   Resolved Ambulatory Problems    Diagnosis Date Noted  . No Resolved Ambulatory Problems   Past Medical History:  Diagnosis Date  . Herpes   . PVC (premature ventricular contraction)      Hospital course:  Onset of Labor With Vaginal Delivery      30 y.o. yo G2P1011 at [redacted]w[redacted]d was admitted in Latent Labor on 04/05/2021. Patient had an uncomplicated labor course as follows:  Membrane Rupture Time/Date: 8:09 PM ,04/05/2021   Delivery Method:Vaginal, Spontaneous  Episiotomy: None  Lacerations:  None  Patient had an uncomplicated postpartum course.  She is ambulating, tolerating a regular diet, passing flatus, and urinating well. Patient is discharged home in stable condition on 04/07/21.  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 Had svd on 5/28  over clitoral tear with repair, qbl , hgb drop of 12-9.8 asymptomatic, stable in PO Iron. Patient up ad lib, denies syncope or dizziness. Reports consuming regular diet without issues and denies N/V. Patient reports 0 bowel movement + passing flatus.  Denies issues with urination and reports bleeding is "lighter."  Patient is breastfeeding and reports going well.  Desires paraguard for postpartum contraception.  Pain is being appropriately managed with use of po meds.    Physical exam  Vitals:   04/06/21 1145 04/06/21 1630 04/06/21 2040 04/07/21 0514  BP: 118/70 (!) 120/58 122/66 107/62  Pulse: 77 86 83 76  Resp: 18 20 20 20   Temp: 97.8 F (36.6 C) 98.2 F (36.8 C) 97.9 F (36.6 C) 97.6 F (36.4 C)  TempSrc: Oral Oral Oral Oral  SpO2: 100%  100% 100%  Weight:      Height:       General: alert,  cooperative and no distress Lochia: appropriate Uterine Fundus: firm Perineum: intact DVT Evaluation: No evidence of DVT seen on physical exam. Negative Homan's sign. No cords or calf tenderness. No significant calf/ankle edema.  Labs: Lab Results  Component Value Date   WBC 17.5 (H) 04/06/2021   HGB 9.8 (L) 04/06/2021   HCT 29.4 (L) 04/06/2021   MCV 86.7 04/06/2021   PLT 161 04/06/2021   CMP Latest Ref Rng & Units 02/17/2014  Glucose 70 - 99 mg/dL 04/19/2014)  BUN 6 - 23 mg/dL 12  Creatinine 735(H - 2.99 mg/dL 2.42  Sodium 6.83 - 419 mEq/L 142  Potassium 3.7 - 5.3 mEq/L 3.5(L)  Chloride 96 - 112 mEq/L 105  CO2 19 - 32 mEq/L -  Calcium 8.4 - 10.5 mg/dL -  Total Protein 6.0 - 8.3 g/dL -  Total Bilirubin 0.3 - 1.2 mg/dL -  Alkaline Phos 39 - 622 U/L -  AST 0 - 37 U/L -  ALT 0 - 35 U/L -    Date of discharge: 04/07/2021 Discharge Diagnoses: Term Pregnancy-delivered Discharge instruction: per After Visit Summary and "Baby and Me Booklet".  After visit meds:   Activity:           unrestricted and pelvic rest Advance as tolerated. Pelvic rest for 6 weeks.  Diet:                routine Medications: PNV, Ibuprofen, Colace and Iron Postpartum contraception: IUD Paragard Condition:  Pt discharge to home with baby in stable and condition Anemia: PO Iron   Meds: Allergies as of 04/07/2021   No Known Allergies     Medication List    STOP taking these medications   amoxicillin 500 MG tablet Commonly known as: AMOXIL   aspirin-acetaminophen-caffeine 250-250-65 MG tablet Commonly known as: EXCEDRIN MIGRAINE   cyanocobalamin 100 MCG tablet   cyclobenzaprine 10 MG tablet Commonly known as: FLEXERIL   diphenhydramine-acetaminophen 25-500 MG Tabs tablet Commonly known as: TYLENOL PM   Fish Oil 1000 MG Caps   terconazole 0.4 % vaginal cream Commonly known as: Terazol 7   valACYclovir 500 MG tablet Commonly known as: VALTREX   vitamin C 100 MG tablet   ZINC 15 PO      TAKE these medications   ibuprofen 600 MG tablet Commonly known as: ADVIL Take 1 tablet (600 mg total) by mouth every 6 (six) hours.   iron polysaccharides 150 MG capsule Commonly known as: NIFEREX Take 1 capsule (150 mg total) by mouth daily.   multivitamin-prenatal 27-0.8 MG Tabs tablet Take 1 tablet  by mouth daily at 12 noon.       Discharge Follow Up:   Follow-up Information    [facility]. Schedule an appointment as soon as possible for a visit in 6 week(s).   Specialty: Obstetrics and Gynecology Contact information: [address] [place] [address] [place] [address] [phone]               [place], NP-C, CNM 04/07/2021, 10:43 AM  [place] , FNP

## 2021-04-07 NOTE — Lactation Note (Signed)
This note was copied from a baby's chart. Lactation Consultation Note  Patient Name: Tammy Rivers VJKQA'S Date: 04/07/2021   Age:30 hours P1, term female infant. LC entered the room, mom and infant asleep at this time.  Maternal Data    Feeding    LATCH Score Latch: Repeated attempts needed to sustain latch, nipple held in mouth throughout feeding, stimulation needed to elicit sucking reflex.  Audible Swallowing: A few with stimulation  Type of Nipple: Everted at rest and after stimulation  Comfort (Breast/Nipple): Soft / non-tender  Hold (Positioning): No assistance needed to correctly position infant at breast.  LATCH Score: 8   Lactation Tools Discussed/Used    Interventions    Discharge    Consult Status      Danelle Earthly 04/07/2021, 12:08 AM

## 2021-04-07 NOTE — Lactation Note (Signed)
This note was copied from a baby's chart. Lactation Consultation Note  Patient Name: Boy Marchia Diguglielmo MAYOK'H Date: 04/07/2021 Reason for consult: Follow-up assessment;Primapara;1st time breastfeeding;Term;Infant weight loss;Other (Comment) (3 % weight loss / post circ and baby not showing signs of hunger/ sleeping . LC recommended since the baby fed last at 7:40 - by 11am STS) Age:30 hours Per mom breast feeding is going well , baby cluster fed all night, and the side lying position if working the best.  LC recommended when the baby is really hungry latch the baby in the position the baby latches best and after the 1st breast / work on a different position. Mom denies soreness, hearing swallows with feedings.  Breast feeding teaching - see below.  Mom aware she can call for Latch assistance.  Maternal Data    Feeding Mother's Current Feeding Choice: Breast Milk  LATCH Score                    Lactation Tools Discussed/Used    Interventions Interventions: Breast feeding basics reviewed;Education  Discharge Discharge Education: Engorgement and breast care;Warning signs for feeding baby Pump: Personal;Manual;DEBP  Consult Status Consult Status: Complete Date: 04/07/21    Kathrin Greathouse 04/07/2021, 10:40 AM

## 2021-04-08 ENCOUNTER — Ambulatory Visit: Payer: Self-pay

## 2021-04-08 NOTE — Lactation Note (Signed)
This note was copied from a baby's chart. Lactation Consultation Note  Patient Name: Tammy Rivers EPPIR'J Date: 04/08/2021 Reason for consult: Follow-up assessment;Primapara;1st time breastfeeding;Term;Infant weight loss;Other (Comment) (7 % weight loss) Age:30 hours  Per mom baby received a bottle this morning due to the weight loss of 7 %.  LC reviewed the doc flow sheets and updated.  Per mom breast are fuller today. LC recommended offering the breast 1st and if baby still hungry after the 1st breast offer the 2nd breast.  Per mom baby doesn't latch on the left as well as the right breast. LC discussed positioning the baby on the left breast in a football so the baby is lying on his left side like he latches on the right breast.  LC unable to check a latch due to the baby receiving a formula feeding.  LC discussed the importance of the 1st 2 weeks of breast feeding / supply and demand/ Importance of 8-12 feedings at the breast.  D/C teaching reviewed - see below .   Maternal Data    Feeding Mother's Current Feeding Choice: Breast Milk and Formula Nipple Type: Slow - flow  LATCH Score                    Lactation Tools Discussed/Used - mom already has a hand pump.     Interventions    Discharge Discharge Education: Engorgement and breast care;Warning signs for feeding baby  Consult Status Consult Status: Complete Date: 04/08/21    Kathrin Greathouse 04/08/2021, 9:40 AM

## 2021-05-31 ENCOUNTER — Other Ambulatory Visit: Payer: Self-pay | Admitting: Obstetrics & Gynecology

## 2021-07-22 ENCOUNTER — Other Ambulatory Visit: Payer: Self-pay

## 2021-07-22 DIAGNOSIS — R1032 Left lower quadrant pain: Secondary | ICD-10-CM | POA: Diagnosis not present

## 2021-07-22 DIAGNOSIS — Z5321 Procedure and treatment not carried out due to patient leaving prior to being seen by health care provider: Secondary | ICD-10-CM | POA: Insufficient documentation

## 2021-07-22 LAB — CBC
HCT: 40.4 % (ref 36.0–46.0)
Hemoglobin: 13.1 g/dL (ref 12.0–15.0)
MCH: 27.3 pg (ref 26.0–34.0)
MCHC: 32.4 g/dL (ref 30.0–36.0)
MCV: 84.2 fL (ref 80.0–100.0)
Platelets: 218 10*3/uL (ref 150–400)
RBC: 4.8 MIL/uL (ref 3.87–5.11)
RDW: 13.1 % (ref 11.5–15.5)
WBC: 8 10*3/uL (ref 4.0–10.5)
nRBC: 0 % (ref 0.0–0.2)

## 2021-07-22 LAB — URINALYSIS, COMPLETE (UACMP) WITH MICROSCOPIC
Bacteria, UA: NONE SEEN
Bilirubin Urine: NEGATIVE
Glucose, UA: NEGATIVE mg/dL
Ketones, ur: NEGATIVE mg/dL
Leukocytes,Ua: NEGATIVE
Nitrite: NEGATIVE
Protein, ur: NEGATIVE mg/dL
Specific Gravity, Urine: 1.013 (ref 1.005–1.030)
pH: 5 (ref 5.0–8.0)

## 2021-07-22 LAB — COMPREHENSIVE METABOLIC PANEL
ALT: 18 U/L (ref 0–44)
AST: 20 U/L (ref 15–41)
Albumin: 4.2 g/dL (ref 3.5–5.0)
Alkaline Phosphatase: 47 U/L (ref 38–126)
Anion gap: 7 (ref 5–15)
BUN: 12 mg/dL (ref 6–20)
CO2: 26 mmol/L (ref 22–32)
Calcium: 9.3 mg/dL (ref 8.9–10.3)
Chloride: 106 mmol/L (ref 98–111)
Creatinine, Ser: 0.93 mg/dL (ref 0.44–1.00)
GFR, Estimated: >60 mL/min (ref >60)
Glucose, Bld: 98 mg/dL (ref 70–99)
Potassium: 3.6 mmol/L (ref 3.5–5.1)
Sodium: 139 mmol/L (ref 135–145)
Total Bilirubin: 0.6 mg/dL (ref 0.3–1.2)
Total Protein: 7.2 g/dL (ref 6.5–8.1)

## 2021-07-22 LAB — POC URINE PREG, ED: Preg Test, Ur: NEGATIVE

## 2021-07-22 NOTE — ED Triage Notes (Signed)
Pt in with co LLQ that started yesterday, pt had ParaGard placed 6 weeks ago and has not had any problems other then heavy bleeding.

## 2021-07-23 ENCOUNTER — Emergency Department
Admission: EM | Admit: 2021-07-23 | Discharge: 2021-07-23 | Disposition: A | Discharge status: Home / Self Care | Payer: Medicaid Other | Attending: Emergency Medicine | Admitting: Emergency Medicine

## 2021-07-23 NOTE — ED Notes (Addendum)
No answer when called several times from lobby; no answer when phone # listed in chart called 

## 2021-07-23 NOTE — ED Notes (Signed)
No answer when called several times from lobby 

## 2022-06-06 ENCOUNTER — Other Ambulatory Visit: Payer: Self-pay

## 2022-06-06 ENCOUNTER — Emergency Department: Payer: Medicaid Other

## 2022-06-06 ENCOUNTER — Emergency Department
Admission: EM | Admit: 2022-06-06 | Discharge: 2022-06-06 | Disposition: A | Discharge status: Home / Self Care | Payer: Medicaid Other | Attending: Emergency Medicine | Admitting: Emergency Medicine

## 2022-06-06 DIAGNOSIS — W208XXA Other cause of strike by thrown, projected or falling object, initial encounter: Secondary | ICD-10-CM | POA: Diagnosis not present

## 2022-06-06 DIAGNOSIS — S20211A Contusion of right front wall of thorax, initial encounter: Secondary | ICD-10-CM | POA: Diagnosis not present

## 2022-06-06 DIAGNOSIS — S299XXA Unspecified injury of thorax, initial encounter: Secondary | ICD-10-CM | POA: Diagnosis present

## 2022-06-06 MED ORDER — CYCLOBENZAPRINE HCL 5 MG PO TABS: 5.0000 mg | ORAL_TABLET | Freq: Three times a day (TID) | ORAL | 0 refills | Status: AC | PRN

## 2022-06-06 MED ORDER — LIDOCAINE 5 % EX PTCH: 1.0000 | MEDICATED_PATCH | Freq: Two times a day (BID) | CUTANEOUS | 0 refills | Status: AC | PRN

## 2022-06-06 MED ORDER — IBUPROFEN 800 MG PO TABS: 800.0000 mg | ORAL_TABLET | Freq: Three times a day (TID) | ORAL | 0 refills | Status: DC | PRN

## 2022-06-06 NOTE — ED Triage Notes (Signed)
Pt to ED POV for R rib injury and pain.  Pt was at gym lifting weights and dropped a 45 pound weight on self which landed to R ribcage. States is painful to palpation and deep inspiration.  LNMP 05/17/22, provider seeing pt in triage room.  Respirations are unlabored.

## 2022-06-06 NOTE — Discharge Instructions (Addendum)
Your exam and XR confirm rib contusion and chest wall pain, without rib fractures. Take the prescription meds and use the lidocaine patches as directed. Apply ice and/or moist heat to reduce symptoms. You may experience chest wall pain for several days to weeks. Return as needed.

## 2022-06-06 NOTE — ED Provider Triage Note (Signed)
Emergency Medicine Provider Triage Evaluation Note  Tammy Rivers , a 31 y.o. female  was evaluated in triage.  Pt complains of right rib pain today when lifting weights. Increase pain with deep breaths.   No OTC meds taken  Review of Systems  Positive:  Negative:   Physical Exam  There were no vitals taken for this visit. Gen:   Awake, no distress   Resp:  Normal effort  MSK:   Moves extremities without difficulty  Other:  Right anterior rib pain  Medical Decision Making  Medically screening exam initiated at 2:10 PM.  Appropriate orders placed.  Tammy Rivers was informed that the remainder of the evaluation will be completed by another provider, this initial triage assessment does not replace that evaluation, and the importance of remaining in the ED until their evaluation is complete.     Tommi Rumps, PA-C 06/06/22 1413

## 2022-06-06 NOTE — ED Provider Notes (Signed)
[facility] Emergency Department Provider Note     Event Date/Time   First MD Initiated Contact with Patient 06/06/22 1511     (approximate)   History   Rib Injury   HPI  Tammy Rivers is a 31 y.o. female presents to the ED with acute right anterior rib pain, after she dropped a barbell on her chest this morning.  Patient was doing presses on a bench, when she lost control of the barbell.  She denies any head injury, LOC, or hemoptysis.  She presents several hours later with persistent right anterior rib pain as well as some shortness of breath with inspiration.  She denies any distal paresthesias.   Physical Exam   Triage Vital Signs: ED Triage Vitals  Enc Vitals Group     BP 06/06/22 1412 106/67     Pulse Rate 06/06/22 1412 64     Resp 06/06/22 1412 16     Temp --      Temp src --      SpO2 06/06/22 1412 96 %     Weight 06/06/22 1413 190 lb (86.2 kg)     Height 06/06/22 1413 5\' 3"  (1.6 m)     Head Circumference --      Peak Flow --      Pain Score 06/06/22 1412 7     Pain Loc --      Pain Edu? --      Excl. in GC? --     Most recent vital signs: Vitals:   06/06/22 1412  BP: 106/67  Pulse: 64  Resp: 16  SpO2: 96%    General Awake, no distress. NAD CV:  Good peripheral perfusion.  RESP:  Normal effort.  No obvious chest wall deformity, flail chest, ecchymosis, or abrasions.  Patient tender to palpation over the anterior right rib border.  Normal and symmetric chest rise with inspiration.  No adventitious breath sounds noted. ABD:  No distention.     ED Results / Procedures / Treatments   Labs (all labs ordered are listed, but only abnormal results are displayed) Labs Reviewed - No data to display   EKG   RADIOLOGY  I personally viewed and evaluated these images as part of my medical decision making, as well as reviewing the written report by the radiologist.  ED Provider Interpretation: no acute findings}  DG Ribs  Unilateral W/Chest Right  Result Date: 06/06/2022 CLINICAL DATA:  Right rib pain EXAM: RIGHT RIBS AND CHEST - 3+ VIEW COMPARISON:  06/16/2017 FINDINGS: No displaced fracture or other bone lesions are seen involving the ribs. There is no evidence of pneumothorax or pleural effusion. Both lungs are clear. Heart size and mediastinal contours are within normal limits. IMPRESSION: No displaced rib fracture. Electronically Signed   By: 08/16/2017 M.D.   On: 06/06/2022 14:55     PROCEDURES:  Critical Care performed: No  Procedures   MEDICATIONS ORDERED IN ED: Medications - No data to display   IMPRESSION / MDM / ASSESSMENT AND PLAN / ED COURSE  I reviewed the triage vital signs and the nursing notes.                              Differential diagnosis includes, but is not limited to, rib fracture, rib contusion, chest wall abrasion, pneumothorax, CAP  Patient's presentation is most consistent with acute complicated illness / injury requiring diagnostic workup.  To the ED for evaluation of acute chest wall pain after mechanical injury.  Her exam is reassuring vital signs are stable, no signs of acute respiratory distress.  Neurologic evidence of any acute rib fracture or pneumothorax, based on my review of images and radiology report.  Patient's diagnosis is consistent with his wall contusion. Patient will be discharged home with prescriptions for Flexeril, ibuprofen, and lidocaine patches. Patient is to follow up with primary provider as needed or otherwise directed. Patient is given ED precautions to return to the ED for any worsening or new symptoms.     FINAL CLINICAL IMPRESSION(S) / ED DIAGNOSES   Final diagnoses:  Rib contusion, right, initial encounter     Rx / DC Orders   ED Discharge Orders          Ordered    cyclobenzaprine (FLEXERIL) 5 MG tablet  3 times daily PRN        06/06/22 1517    ibuprofen (ADVIL) 800 MG tablet  Every 8 hours PRN        06/06/22 1517     lidocaine (LIDODERM) 5 %  Every 12 hours PRN        06/06/22 1517             Note:  This document was prepared using Dragon voice recognition software and may include unintentional dictation errors.    Lissa Hoard, PA-C 06/06/22 1544    Chesley Noon, MD 06/06/22 1753

## 2022-08-01 ENCOUNTER — Other Ambulatory Visit: Payer: Self-pay | Admitting: Obstetrics & Gynecology

## 2022-11-11 ENCOUNTER — Encounter: Payer: Self-pay | Admitting: Emergency Medicine

## 2022-11-11 ENCOUNTER — Emergency Department
Admission: EM | Admit: 2022-11-11 | Discharge: 2022-11-11 | Disposition: A | Discharge status: Home / Self Care | Payer: Medicaid Other | Attending: Emergency Medicine | Admitting: Emergency Medicine

## 2022-11-11 ENCOUNTER — Other Ambulatory Visit: Payer: Self-pay

## 2022-11-11 ENCOUNTER — Emergency Department: Payer: Medicaid Other

## 2022-11-11 DIAGNOSIS — R0789 Other chest pain: Secondary | ICD-10-CM | POA: Diagnosis present

## 2022-11-11 DIAGNOSIS — R0602 Shortness of breath: Secondary | ICD-10-CM | POA: Insufficient documentation

## 2022-11-11 LAB — COMPREHENSIVE METABOLIC PANEL
ALT: 15 U/L (ref 0–44)
AST: 19 U/L (ref 15–41)
Albumin: 4 g/dL (ref 3.5–5.0)
Alkaline Phosphatase: 43 U/L (ref 38–126)
Anion gap: 12 (ref 5–15)
BUN: 18 mg/dL (ref 6–20)
CO2: 24 mmol/L (ref 22–32)
Calcium: 9.1 mg/dL (ref 8.9–10.3)
Chloride: 103 mmol/L (ref 98–111)
Creatinine, Ser: 0.9 mg/dL (ref 0.44–1.00)
GFR, Estimated: >60 mL/min (ref >60)
Glucose, Bld: 110 mg/dL — ABNORMAL HIGH (ref 70–99)
Potassium: 3.6 mmol/L (ref 3.5–5.1)
Sodium: 139 mmol/L (ref 135–145)
Total Bilirubin: 0.5 mg/dL (ref 0.3–1.2)
Total Protein: 7.3 g/dL (ref 6.5–8.1)

## 2022-11-11 LAB — CBC WITH DIFFERENTIAL/PLATELET
Abs Immature Granulocytes: 0.07 10*3/uL (ref 0.00–0.07)
Basophils Absolute: 0 10*3/uL (ref 0.0–0.1)
Basophils Relative: 0 %
Eosinophils Absolute: 0 10*3/uL (ref 0.0–0.5)
Eosinophils Relative: 0 %
HCT: 40.7 % (ref 36.0–46.0)
Hemoglobin: 13.2 g/dL (ref 12.0–15.0)
Immature Granulocytes: 0 %
Lymphocytes Relative: 10 %
Lymphs Abs: 1.7 10*3/uL (ref 0.7–4.0)
MCH: 28 pg (ref 26.0–34.0)
MCHC: 32.4 g/dL (ref 30.0–36.0)
MCV: 86.2 fL (ref 80.0–100.0)
Monocytes Absolute: 1 10*3/uL (ref 0.1–1.0)
Monocytes Relative: 6 %
Neutro Abs: 13.9 10*3/uL — ABNORMAL HIGH (ref 1.7–7.7)
Neutrophils Relative %: 84 %
Platelets: 221 10*3/uL (ref 150–400)
RBC: 4.72 MIL/uL (ref 3.87–5.11)
RDW: 12.4 % (ref 11.5–15.5)
WBC: 16.6 10*3/uL — ABNORMAL HIGH (ref 4.0–10.5)
nRBC: 0 % (ref 0.0–0.2)

## 2022-11-11 LAB — HCG, QUANTITATIVE, PREGNANCY: hCG, Beta Chain, Quant, S: 1 m[IU]/mL (ref <5)

## 2022-11-11 LAB — TROPONIN I (HIGH SENSITIVITY)
Troponin I (High Sensitivity): <2 ng/L (ref <18)
Troponin I (High Sensitivity): 3 ng/L (ref <18)

## 2022-11-11 MED ORDER — NAPROXEN 500 MG PO TABS: 500.0000 mg | ORAL_TABLET | Freq: Two times a day (BID) | ORAL | 2 refills | Status: DC

## 2022-11-11 MED ORDER — KETOROLAC TROMETHAMINE 30 MG/ML IJ SOLN
30.0000 mg | Freq: Once | INTRAMUSCULAR | Status: AC
  Administered 2022-11-11: 30 mg via INTRAVENOUS
  Filled 2022-11-11: qty 1

## 2022-11-11 MED ORDER — IOHEXOL 350 MG/ML SOLN
75.0000 mL | Freq: Once | INTRAVENOUS | Status: AC | PRN
  Administered 2022-11-11: 75 mL via INTRAVENOUS

## 2022-11-11 NOTE — ED Triage Notes (Signed)
Pt to triage via w/c with no distress noted; pt reports that she awoke with mid CP radiating to rt side and back accomp by [facility]; st recent miscarriage

## 2022-11-11 NOTE — ED Provider Notes (Signed)
[facility] Provider Note    Event Date/Time   First MD Initiated Contact with Patient 11/11/22 [phone]     (approximate)   History   Chest Pain   HPI  Tammy Rivers is a 32 y.o. female who presents with complaints of right-sided chest pain.  Patient reports that approximately 2 AM this morning she woke up with moderate chest discomfort in her right superior lateral chest, she feels that it radiates to her back.  She reports she feels mildly short of breath.  No injury to the area.  No fevers chills or cough reported.  No calf pain or swelling.  No history of DVT     Physical Exam   Triage Vital Signs: ED Triage Vitals  Enc Vitals Group     BP 11/11/22 0459 123/74     Pulse Rate 11/11/22 0459 (!) 106     Resp 11/11/22 0459 20     Temp 11/11/22 0459 98.4 F (36.9 C)     Temp Source 11/11/22 0459 Oral     SpO2 11/11/22 0459 100 %     Weight 11/11/22 0445 86.2 kg (190 lb)     Height 11/11/22 0445 1.575 m (5\' 2" )     Head Circumference --      Peak Flow --      Pain Score 11/11/22 0445 10     Pain Loc --      Pain Edu? --      Excl. in [place]? --     Most recent vital signs: Vitals:   11/11/22 0459 11/11/22 0656  BP: 123/74 113/66  Pulse: (!) 106 85  Resp: 20 18  Temp: 98.4 F (36.9 C) 98 F (36.7 C)  SpO2: 100% 98%     General: Awake, no distress.  CV:  Good peripheral perfusion.  Regular rate and rhythm, no chest wall tenderness, no rash Resp:  Normal effort.  Clear to auscultation bilaterally Abd:  No distention.  Other:  No calf pain or tenderness   ED Results / Procedures / Treatments   Labs (all labs ordered are listed, but only abnormal results are displayed) Labs Reviewed  CBC WITH DIFFERENTIAL/PLATELET - Abnormal; Notable for the following components:      Result Value   WBC 16.6 (*)    Neutro Abs 13.9 (*)    All other components within normal limits  COMPREHENSIVE METABOLIC PANEL - Abnormal; Notable for the following  components:   Glucose, Bld 110 (*)    All other components within normal limits  HCG, QUANTITATIVE, PREGNANCY  TROPONIN I (HIGH SENSITIVITY)  TROPONIN I (HIGH SENSITIVITY)     EKG  ED ECG REPORT I, Lavonia Drafts, the attending physician, personally viewed and interpreted this ECG.  Date: 11/11/2022  Rhythm: normal sinus rhythm QRS Axis: normal Intervals: normal ST/T Wave abnormalities: normal Narrative Interpretation: no evidence of acute ischemia    RADIOLOGY Chest x-ray viewed interpreted by me, no pneumonia    PROCEDURES:  Critical Care performed:   Procedures   MEDICATIONS ORDERED IN ED: Medications  ketorolac (TORADOL) 30 MG/ML injection 30 mg (30 mg Intravenous Given 11/11/22 0835)  iohexol (OMNIPAQUE) 350 MG/ML injection 75 mL (75 mLs Intravenous Contrast Given 11/11/22 0808)     IMPRESSION / MDM / ASSESSMENT AND PLAN / ED COURSE  I reviewed the triage vital signs and the nursing notes. Patient's presentation is most consistent with acute presentation with potential threat to life or bodily function.  Patient  presents with chest pain as above.  Differential includes musculoskeletal chest pain, PE, pneumonia, not consistent with ACS, reassuring EKG, normal high sensitive troponin.  Chest x-ray negative for pneumonia, no cough.  No fever.  Will send for CT angiography given pleuritic chest pain and shortness of breath of sudden onset.  Discussed CT results with radiologist, he notes advanced coronary artery disease for her age.  Her pain is resolved with IV Toradol, her troponins are negative x 2 and her EKG is reassuring.  I have emphasized with her the need for close cardiology follow-up which she agrees with.  Considered admission however no evidence of PE or pneumonia on CT scan, reassuring labs, EKG will treat with NSAIDs, strict return precautions discussed, patient agrees this plan.      FINAL CLINICAL IMPRESSION(S) / ED DIAGNOSES   Final diagnoses:   Atypical chest pain     Rx / DC Orders   ED Discharge Orders          Ordered    Ambulatory referral to Cardiology       Comments: If you have not heard from the Cardiology office within the next 72 hours please call [phone].   11/11/22 0915    naproxen (NAPROSYN) 500 MG tablet  2 times daily with meals        11/11/22 0915             Note:  This document was prepared using Dragon voice recognition software and may include unintentional dictation errors.   Lavonia Drafts, MD 11/11/22 [phone]

## 2022-12-25 ENCOUNTER — Other Ambulatory Visit
Admission: RE | Admit: 2022-12-25 | Discharge: 2022-12-25 | Disposition: A | Discharge status: Home / Self Care | Payer: Medicaid Other | Source: Ambulatory Visit | Attending: Cardiology | Admitting: Cardiology

## 2022-12-25 ENCOUNTER — Ambulatory Visit: Payer: Medicaid Other | Attending: Cardiology | Admitting: Cardiology

## 2022-12-25 ENCOUNTER — Encounter: Payer: Self-pay | Admitting: Cardiology

## 2022-12-25 VITALS — BP 112/70 | HR 60 | Ht 63.0 in | Wt 195.0 lb

## 2022-12-25 DIAGNOSIS — F17200 Nicotine dependence, unspecified, uncomplicated: Secondary | ICD-10-CM | POA: Diagnosis not present

## 2022-12-25 DIAGNOSIS — I2510 Atherosclerotic heart disease of native coronary artery without angina pectoris: Secondary | ICD-10-CM | POA: Diagnosis present

## 2022-12-25 DIAGNOSIS — R079 Chest pain, unspecified: Secondary | ICD-10-CM | POA: Insufficient documentation

## 2022-12-25 LAB — BASIC METABOLIC PANEL
Anion gap: 6 (ref 5–15)
BUN: 11 mg/dL (ref 6–20)
CO2: 25 mmol/L (ref 22–32)
Calcium: 8.7 mg/dL — ABNORMAL LOW (ref 8.9–10.3)
Chloride: 107 mmol/L (ref 98–111)
Creatinine, Ser: 0.76 mg/dL (ref 0.44–1.00)
GFR, Estimated: >60 mL/min (ref >60)
Glucose, Bld: 99 mg/dL (ref 70–99)
Potassium: 3.7 mmol/L (ref 3.5–5.1)
Sodium: 138 mmol/L (ref 135–145)

## 2022-12-25 LAB — LIPID PANEL
Cholesterol: 148 mg/dL (ref 0–200)
HDL: 53 mg/dL (ref >40)
LDL Cholesterol: 90 mg/dL (ref 0–99)
Total CHOL/HDL Ratio: 2.8 RATIO
Triglycerides: 23 mg/dL (ref <150)
VLDL: 5 mg/dL (ref 0–40)

## 2022-12-25 MED ORDER — ASPIRIN 81 MG PO TBEC: 81.0000 mg | DELAYED_RELEASE_TABLET | Freq: Every day | ORAL | 3 refills | Status: AC

## 2022-12-25 MED ORDER — METOPROLOL TARTRATE 50 MG PO TABS: 50.0000 mg | ORAL_TABLET | Freq: Once | ORAL | 0 refills | Status: DC

## 2022-12-25 NOTE — Patient Instructions (Addendum)
Medication Instructions:   START Aspirin - Take one tablet ( 81 mg) by mouth daily.   *If you need a refill on your cardiac medications before your next appointment, please call your pharmacy*   Lab Work:  Your physician recommends you go to the medical mall for labs   If you have labs (blood work) drawn today and your tests are completely normal, you will receive your results only by: MyChart Message (if you have MyChart) OR A paper copy in the mail If you have any lab test that is abnormal or we need to change your treatment, we will call you to review the results.   Testing/Procedures:  Your physician has requested that you have an echocardiogram. Echocardiography is a painless test that uses sound waves to create images of your heart. It provides your doctor with information about the size and shape of your heart and how well your heart's chambers and valves are working. This procedure takes approximately one hour. There are no restrictions for this procedure. Please do NOT wear cologne, perfume, aftershave, or lotions (deodorant is allowed). Please arrive 15 minutes prior to your appointment time.   2 Your cardiac CT is scheduled for 01/19/2023 @ 8:00am  [facility] [address] [place], [place] [address] [phone]  If scheduled at [facility] or [facility], please arrive 15 mins early for check-in and test prep.   Please follow these instructions carefully (unless otherwise directed):  On the Night Before the Test: Be sure to Drink plenty of water. Do not consume any caffeinated/decaffeinated beverages or chocolate 12 hours prior to your test. Do not take any antihistamines 12 hours prior to your test.  On the Day of the Test: Drink plenty of water until 1 hour prior to the test. Do not eat any food 1 hour prior to test. You may take your regular medications prior to the  test.  Take metoprolol (Lopressor) two hours prior to test. FEMALES- please wear underwire-free bra if available, avoid dresses & tight clothing      After the Test: Drink plenty of water. After receiving IV contrast, you may experience a mild flushed feeling. This is normal. On occasion, you may experience a mild rash up to 24 hours after the test. This is not dangerous. If this occurs, you can take Benadryl 25 mg and increase your fluid intake. If you experience trouble breathing, this can be serious. If it is severe call 911 IMMEDIATELY. If it is mild, please call our office.  Follow-Up: At [facility], you and your health needs are our priority.  As part of our continuing mission to provide you with exceptional heart care, we have created designated Provider Care Teams.  These Care Teams include your primary Cardiologist (physician) and Advanced Practice Providers (APPs -  Physician Assistants and Nurse Practitioners) who all work together to provide you with the care you need, when you need it.  We recommend signing up for the patient portal called "MyChart".  Sign up information is provided on this After Visit Summary.  MyChart is used to connect with patients for Virtual Visits (Telemedicine).  Patients are able to view lab/test results, encounter notes, upcoming appointments, etc.  Non-urgent messages can be sent to your provider as well.   To learn more about what you can do with MyChart, go to [email or web address].    Your next appointment:    After testing  Provider:  You may see Kate Sable, MD or one of the following Advanced Practice Providers on your designated Care Team:   Murray Hodgkins, NP Christell Faith, PA-C Cadence Kathlen Mody, PA-C Gerrie Nordmann, NP

## 2022-12-25 NOTE — Progress Notes (Signed)
Cardiology Office Note:    Date:  12/25/2022   ID:  Tammy Rivers, DOB 1991-05-25, MRN [id number]  PCP:  Kathyrn Lass   [place] [facility] Cardiologist:  Kate Sable, MD     Referring MD: Lavonia Drafts, MD   Chief Complaint  Patient presents with   New Patient (Initial Visit)    Chest pain, Abnormal CT, No Hx     History of Present Illness:    Tammy Rivers is a 32 y.o. female with a hx of CAD (coronary calcium on chest CT), current smoker x 8 years who presents with chest pain and CAD.  Patient woke up last month with right-sided chest pain waking her up from sleep.  Symptoms were significant enough prompting her to go to the ED.  Workup in the ED with EKG and troponins was unrevealing, Chest CT did reveal coronary calcifications in the LAD calcifications were noted.  Not sure if anyone in the family has cardiac disease.  Endorses smoking, is working on quitting.  Past Medical History:  Diagnosis Date   Herpes    PVC (premature ventricular contraction)     Past Surgical History:  Procedure Laterality Date   ABDOMINOPLASTY  2021   extraction of wisdom teeth      Current Medications: Current Meds  Medication Sig   Ascorbic Acid (VITAMIN C GUMMIES PO) Take by mouth. Unable to confirm dose   aspirin EC 81 MG tablet Take 1 tablet (81 mg total) by mouth daily. Swallow whole.   Cholecalciferol (PA VITAMIN D-3 GUMMY PO) Take by mouth. Unable to confirm dose   cyclobenzaprine (FLEXERIL) 5 MG tablet Take 1 tablet (5 mg total) by mouth 3 (three) times daily as needed.   ibuprofen (ADVIL) 800 MG tablet Take 1 tablet (800 mg total) by mouth every 8 (eight) hours as needed.   iron polysaccharides (NIFEREX) 150 MG capsule Take 1 capsule (150 mg total) by mouth daily.   magnesium 30 MG tablet Take 30 mg by mouth 2 (two) times daily. Unable to confirm dose   metoprolol tartrate (LOPRESSOR) 50 MG tablet Take 1 tablet (50 mg total) by mouth once for 1 dose. TWO HOURS  PRIOR TO CARDIAC CTA   naproxen (NAPROSYN) 500 MG tablet Take 1 tablet (500 mg total) by mouth 2 (two) times daily with a meal. (Patient taking differently: Take 500 mg by mouth 2 (two) times daily between meals as needed.)     Allergies:   Patient has no known allergies.   Social History   Socioeconomic History   Marital status: Single    Spouse name: Not on file   Number of children: Not on file   Years of education: Not on file   Highest education level: Not on file  Occupational History    Comment: unemployed  Tobacco Use   Smoking status: Every Day    Packs/day: 1.00    Types: Cigarettes   Smokeless tobacco: Never  Vaping Use   Vaping Use: Never used  Substance and Sexual Activity   Alcohol use: Not Currently    Comment: denies 10/29   Drug use: No   Sexual activity: Not Currently    Birth control/protection: None  Other Topics Concern   Not on file  Social History Narrative   Not on file   Social Determinants of Health   Financial Resource Strain: Not on file  Food Insecurity: Not on file  Transportation Needs: Not on file  Physical Activity: Not on  file  Stress: Not on file  Social Connections: Not on file     Family History: The patient's family history is not on file.  ROS:   Please see the history of present illness.     All other systems reviewed and are negative.  EKGs/Labs/Other Studies Reviewed:    The following studies were reviewed today:   EKG:  EKG is  ordered today.  The ekg ordered today demonstrates normal sinus rhythm, normal ECG  Recent Labs: 11/11/2022: ALT 15; BUN 18; Creatinine, Ser 0.90; Hemoglobin 13.2; Platelets 221; Potassium 3.6; Sodium 139  Recent Lipid Panel No results found for: "CHOL", "TRIG", "HDL", "CHOLHDL", "VLDL", "LDLCALC", "LDLDIRECT"   Risk Assessment/Calculations:             Physical Exam:    VS:  BP 112/70 (BP Location: Right Arm)   Pulse 60   Ht 5' 3"$  (1.6 m)   Wt 195 lb (88.5 kg)   SpO2 100%    BMI 34.54 kg/m     Wt Readings from Last 3 Encounters:  12/25/22 195 lb (88.5 kg)  11/11/22 190 lb (86.2 kg)  06/06/22 190 lb (86.2 kg)     GEN:  Well nourished, well developed in no acute distress HEENT: Normal NECK: No JVD; No carotid bruits CARDIAC: RRR, no murmurs, rubs, gallops RESPIRATORY:  Clear to auscultation without rales, wheezing or rhonchi  ABDOMEN: Soft, non-tender, non-distended MUSCULOSKELETAL:  No edema; No deformity  SKIN: Warm and dry NEUROLOGIC:  Alert and oriented x 3 PSYCHIATRIC:  Normal affect   ASSESSMENT:    1. Coronary artery disease involving native heart, unspecified vessel or lesion type, unspecified whether angina present   2. Chest pain of uncertain etiology   3. Smoking   4. Chest pain, unspecified type    PLAN:    In order of problems listed above:  CAD, chest pain.  Coronary calcifications in LAD.  Get echocardiogram, get coronary CTA.  Start aspirin 81 mg daily, obtain fasting lipid profile. Current smoker, smoking cessation advised.  Follow-up after echo and coronary CTA.     Medication Adjustments/Labs and Tests Ordered: Current medicines are reviewed at length with the patient today.  Concerns regarding medicines are outlined above.  Orders Placed This Encounter  Procedures   CT CORONARY MORPH W/CTA COR W/SCORE W/CA W/CM &/OR WO/CM   Lipid panel   Basic Metabolic Panel (BMET)   EKG 12-Lead   ECHOCARDIOGRAM COMPLETE   Meds ordered this encounter  Medications   aspirin EC 81 MG tablet    Sig: Take 1 tablet (81 mg total) by mouth daily. Swallow whole.    Dispense:  90 tablet    Refill:  3   metoprolol tartrate (LOPRESSOR) 50 MG tablet    Sig: Take 1 tablet (50 mg total) by mouth once for 1 dose. TWO HOURS PRIOR TO CARDIAC CTA    Dispense:  1 tablet    Refill:  0    Patient Instructions  Medication Instructions:   START Aspirin - Take one tablet ( 81 mg) by mouth daily.   *If you need a refill on your cardiac  medications before your next appointment, please call your pharmacy*   Lab Work:  Your physician recommends you go to the medical mall for labs   If you have labs (blood work) drawn today and your tests are completely normal, you will receive your results only by: [place] (if you have MyChart) OR A paper copy in the mail If  you have any lab test that is abnormal or we need to change your treatment, we will call you to review the results.   Testing/Procedures:  Your physician has requested that you have an echocardiogram. Echocardiography is a painless test that uses sound waves to create images of your heart. It provides your doctor with information about the size and shape of your heart and how well your heart's chambers and valves are working. This procedure takes approximately one hour. There are no restrictions for this procedure. Please do NOT wear cologne, perfume, aftershave, or lotions (deodorant is allowed). Please arrive 15 minutes prior to your appointment time.   2 Your cardiac CT is scheduled for 01/19/2023 @ 8:00am  [facility] [address] [place], [place] [address] [phone]  If scheduled at [facility] or [facility], please arrive 15 mins early for check-in and test prep.   Please follow these instructions carefully (unless otherwise directed):  On the Night Before the Test: Be sure to Drink plenty of water. Do not consume any caffeinated/decaffeinated beverages or chocolate 12 hours prior to your test. Do not take any antihistamines 12 hours prior to your test.  On the Day of the Test: Drink plenty of water until 1 hour prior to the test. Do not eat any food 1 hour prior to test. You may take your regular medications prior to the test.  Take metoprolol (Lopressor) two hours prior to test. FEMALES- please wear underwire-free bra if available, avoid  dresses & tight clothing      After the Test: Drink plenty of water. After receiving IV contrast, you may experience a mild flushed feeling. This is normal. On occasion, you may experience a mild rash up to 24 hours after the test. This is not dangerous. If this occurs, you can take Benadryl 25 mg and increase your fluid intake. If you experience trouble breathing, this can be serious. If it is severe call 911 IMMEDIATELY. If it is mild, please call our office.  Follow-Up: At [facility], you and your health needs are our priority.  As part of our continuing mission to provide you with exceptional heart care, we have created designated Provider Care Teams.  These Care Teams include your primary Cardiologist (physician) and Advanced Practice Providers (APPs -  Physician Assistants and Nurse Practitioners) who all work together to provide you with the care you need, when you need it.  We recommend signing up for the patient portal called "MyChart".  Sign up information is provided on this After Visit Summary.  MyChart is used to connect with patients for Virtual Visits (Telemedicine).  Patients are able to view lab/test results, encounter notes, upcoming appointments, etc.  Non-urgent messages can be sent to your provider as well.   To learn more about what you can do with MyChart, go to [email or web address].    Your next appointment:    After testing  Provider:   You may see Kate Sable, MD or one of the following Advanced Practice Providers on your designated Care Team:   Murray Hodgkins, NP Christell Faith, PA-C Cadence Kathlen Mody, PA-C Gerrie Nordmann, NP     Signed, Kate Sable, MD  12/25/2022 12:27 PM    [place]

## 2022-12-26 ENCOUNTER — Other Ambulatory Visit: Payer: Self-pay

## 2022-12-26 MED ORDER — ATORVASTATIN CALCIUM 10 MG PO TABS: 10.0000 mg | ORAL_TABLET | Freq: Every day | ORAL | 0 refills | Status: DC

## 2022-12-29 ENCOUNTER — Encounter: Payer: Self-pay | Admitting: Cardiology

## 2022-12-30 ENCOUNTER — Telehealth: Payer: Self-pay

## 2022-12-30 MED ORDER — NICOTINE 21-14-7 MG/24HR TD KIT: PACK | TRANSDERMAL | 0 refills | Status: AC

## 2022-12-30 NOTE — Telephone Encounter (Signed)
Nicotine patches sent in to pharmacy on file.

## 2023-01-15 ENCOUNTER — Telehealth (HOSPITAL_COMMUNITY): Payer: Self-pay | Admitting: Emergency Medicine

## 2023-01-15 NOTE — Telephone Encounter (Signed)
Reaching out to patient to offer assistance regarding upcoming cardiac imaging study; pt verbalizes understanding of appt date/time, parking situation and where to check in, pre-test NPO status and medications ordered, and verified current allergies; name and call back number provided for further questions should they arise Marchia Bond RN Navigator Cardiac Imaging [facility] [phone] office [phone] cell   Arrival [address] '50mg'$  metoprolol tartrate  Denies iv issues Aware contrast/nitro

## 2023-01-19 ENCOUNTER — Ambulatory Visit
Admission: RE | Admit: 2023-01-19 | Discharge: 2023-01-19 | Disposition: A | Discharge status: Home / Self Care | Payer: Medicaid Other | Source: Ambulatory Visit | Attending: Cardiology | Admitting: Cardiology

## 2023-01-19 DIAGNOSIS — R079 Chest pain, unspecified: Secondary | ICD-10-CM | POA: Diagnosis present

## 2023-01-19 MED ORDER — NITROGLYCERIN 0.4 MG SL SUBL
0.4000 mg | SUBLINGUAL_TABLET | Freq: Once | SUBLINGUAL | Status: AC
  Administered 2023-01-19: 0.4 mg via SUBLINGUAL

## 2023-01-19 MED ORDER — IOHEXOL 350 MG/ML SOLN
100.0000 mL | Freq: Once | INTRAVENOUS | Status: AC | PRN
  Administered 2023-01-19: 100 mL via INTRAVENOUS

## 2023-01-19 NOTE — Progress Notes (Signed)
Patient tolerated procedure well. Ambulate w/o difficulty. Denies light headedness or being dizzy. Encouraged to drink extra water today and reasoning explained. Verbalized understanding. All questions answered. ABC intact. No further needs. Discharge from procedure area w/o issues.   

## 2023-01-20 ENCOUNTER — Other Ambulatory Visit: Payer: Self-pay

## 2023-01-20 MED ORDER — ATORVASTATIN CALCIUM 10 MG PO TABS: 10.0000 mg | ORAL_TABLET | Freq: Every day | ORAL | 3 refills | Status: DC

## 2023-02-11 ENCOUNTER — Ambulatory Visit: Payer: Medicaid Other | Attending: Cardiology

## 2023-02-11 DIAGNOSIS — R079 Chest pain, unspecified: Secondary | ICD-10-CM

## 2023-02-11 DIAGNOSIS — I2510 Atherosclerotic heart disease of native coronary artery without angina pectoris: Secondary | ICD-10-CM

## 2023-02-11 LAB — ECHOCARDIOGRAM COMPLETE
AR max vel: 3.08 cm2
AV Area VTI: 3.13 cm2
AV Area mean vel: 2.89 cm2
AV Mean grad: 2 mmHg
AV Peak grad: 4.2 mmHg
Ao pk vel: 1.03 m/s
Area-P 1/2: 3.31 cm2
Est EF: 55
S' Lateral: 3.7 cm

## 2023-02-17 ENCOUNTER — Encounter: Payer: Self-pay | Admitting: Cardiology

## 2023-02-17 ENCOUNTER — Ambulatory Visit: Payer: Medicaid Other | Attending: Cardiology | Admitting: Cardiology

## 2023-02-17 VITALS — BP 120/60 | HR 58 | Ht 63.0 in | Wt 200.8 lb

## 2023-02-17 DIAGNOSIS — I2510 Atherosclerotic heart disease of native coronary artery without angina pectoris: Secondary | ICD-10-CM

## 2023-02-17 DIAGNOSIS — F17200 Nicotine dependence, unspecified, uncomplicated: Secondary | ICD-10-CM

## 2023-02-17 DIAGNOSIS — Z6835 Body mass index (BMI) 35.0-35.9, adult: Secondary | ICD-10-CM

## 2023-02-17 DIAGNOSIS — E782 Mixed hyperlipidemia: Secondary | ICD-10-CM | POA: Diagnosis not present

## 2023-02-17 MED ORDER — ATORVASTATIN CALCIUM 10 MG PO TABS: 10.0000 mg | ORAL_TABLET | Freq: Every day | ORAL | 3 refills | Status: AC

## 2023-02-17 MED ORDER — BUPROPION HCL ER (SR) 150 MG PO TB12: 150.0000 mg | ORAL_TABLET | Freq: Two times a day (BID) | ORAL | 3 refills | Status: AC

## 2023-02-17 NOTE — Progress Notes (Signed)
Cardiology Office Note:   Date:  02/17/2023  ID:  Tammy Rivers, DOB 22-Apr-1991, MRN [id number]  History of Present Illness:   Tammy Rivers is a 32 y.o. female with a past medical history of coronary calcium on chest CT, coronary artery disease, current smoker x 8 years, who is here today to follow up on recent complaints of chest pain.   She was last seen in clinic 12/25/22 by Dr. Azucena Cecil after a recent visit to the emergency department for complaints of chest pain. Work-up was unrevealing. She was scheduled for an echocardiogram, coronary CTA, and started on asa 81 mg daily.  She returns to clinic today stating that overall she has been doing well. She continues to have occasional chest discomfort when she is stressed or upset. She has been working to smoking cessation but has been breaking ut from a rash from the Nicoderm patches. She also never received the previously ordered statin therapy for her cholesterol. She does have an increased amount of stress being a single parent and trying to balance everything. She denies any recent hospitalizations or visits to the emergency department.   ROS: 10 point review of system completed and is considered negative with exception of what listed in HPI  Studies Reviewed:    EKG: No new tracings were completed today  TTE 02/11/23 1. Left ventricular ejection fraction, by estimation, is 55%. The left  ventricle has normal function. The left ventricle has no regional wall  motion abnormalities. Left ventricular diastolic parameters were normal.  The average left ventricular global  longitudinal strain is -23.4 %. The global longitudinal strain is normal.   2. Right ventricular systolic function is normal. The right ventricular  size is normal.   3. The mitral valve is normal in structure. No evidence of mitral valve  regurgitation.   4. The aortic valve is tricuspid. Aortic valve regurgitation is not  visualized.   Coronary CTA 01/19/23 1.  Coronary calcium score of 351.   2. Normal coronary origin with left dominance.   3. Mild proximal LAD stenosis (25%).   4. 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   5. CAD-RADS 2. Mild non-obstructive CAD (25-49%). Consider non-atherosclerotic causes of chest pain. Consider preventive therapy and risk factor modification.  Risk Assessment/Calculations:              Physical Exam:   VS:  BP 120/60 (BP Location: Left Arm, Patient Position: Sitting, Cuff Size: Normal)   Pulse (!) 58   Ht 5\' 3"  (1.6 m)   Wt 200 lb 12.8 oz (91.1 kg)   SpO2 98%   BMI 35.57 kg/m    Wt Readings from Last 3 Encounters:  02/17/23 200 lb 12.8 oz (91.1 kg)  12/25/22 195 lb (88.5 kg)  11/11/22 190 lb (86.2 kg)     GEN: Well nourished, well developed in no acute distress NECK: No JVD; No carotid bruits CARDIAC: RRR, no murmurs, rubs, gallops RESPIRATORY:  Clear to auscultation without rales, wheezing or rhonchi  ABDOMEN: Soft, non-tender, non-distended EXTREMITIES:  No edema; No deformity   ASSESSMENT AND PLAN:   Coronary artery disease with coronary calcifications noted in the LAD on previous coronary CTA.  She was started on aspirin 81 mg daily atorvastatin.  Unfortunately she states that she has never received the atorvastatin but has been taking the aspirin 81 mg daily.  She also underwent echocardiogram that revealed LVEF 55%, no regional wall motion abnormalities, and no  valvular abnormalities were noted.  She has been encouraged to continue with preventative therapy and risk factor modification.  Mixed hyperlipidemia with an LDL of 92/10/24.  With coronary artery disease her goal should be 70 or less.  She has been started with the prescription sent in stable atorvastatin 10 mg daily.  She will need a repeat lipid and hepatic panel in 3 months to reevaluate to determine if she is at goal.  Current smoker has recently tried NicoDerm patches.  Unfortunately she is breaking  out having rashes to the patches.  She has been started on Wellbutrin 50 mg twice daily for smoking cessation today.  She has been advised to take 150 mg tablets x 3 days then increase to twice daily.  With taking the second dose after 7 PM.  She has been given side effects and advised to notify the office if any adverse effects from the medication.  Obesity with BMI 35.5. She has previously lost large quantities of weight in the past.  She is encouraged to continue with her weight loss journey by decreasing her caloric intake and increasing her activity.  Disposition patient return to clinic to see MD/APP in 3 months or sooner if needed.        Signed, Damali Broadfoot, NP

## 2023-02-17 NOTE — Patient Instructions (Signed)
Medication Instructions:  Your physician has recommended you make the following change in your medication:   START - atorvastatin (LIPITOR) 10 MG tablet - Take 1 tablet by mouth daily START - buPROPion (WELLBUTRIN SR) 150 MG 12 hr tablet - Take 1 tablet by mouth daily for first 3 days. Then take 1 tablet by mouth twice daily (DO NOT TAKE 2nd DOSE AFTER 7:00 PM)   *If you need a refill on your cardiac medications before your next appointment, please call your pharmacy*   Lab Work: Your physician recommends that you return for lab work in: 3 months for fasting lipid panel and hepatic Comptroller at [facility] 1st desk on the right to check in (REGISTRATION)  Lab hours: Monday- Friday (7:30 am- 5:30 pm)  If you have labs (blood work) drawn today and your tests are completely normal, you will receive your results only by: MyChart Message (if you have MyChart) OR A paper copy in the mail If you have any lab test that is abnormal or we need to change your treatment, we will call you to review the results.   Testing/Procedures: -None ordered   Follow-Up: At [facility], you and your health needs are our priority.  As part of our continuing mission to provide you with exceptional heart care, we have created designated Provider Care Teams.  These Care Teams include your primary Cardiologist (physician) and Advanced Practice Providers (APPs -  Physician Assistants and Nurse Practitioners) who all work together to provide you with the care you need, when you need it.  We recommend signing up for the patient portal called "MyChart".  Sign up information is provided on this After Visit Summary.  MyChart is used to connect with patients for Virtual Visits (Telemedicine).  Patients are able to view lab/test results, encounter notes, upcoming appointments, etc.  Non-urgent messages can be sent to your provider as well.   To learn more about what you can do with MyChart, go to  [email or web address].    Your next appointment:   3 month(s)  Provider:   You may see Debbe Odea, MD or one of the following Advanced Practice Providers on your designated Care Team:   Nicolasa Ducking, NP Eula Listen, PA-C Cadence Fransico Michael, PA-C Charlsie Quest, NP    Other Instructions -None

## 2023-05-27 ENCOUNTER — Ambulatory Visit: Payer: Medicaid Other | Admitting: Cardiology

## 2023-07-02 NOTE — Progress Notes (Deleted)
Cardiology Office Note:  .   Date:  07/02/2023  ID:  Tammy Rivers, DOB Mar 30, 1991, MRN [id number] PCP: Aviva Kluver  [facility] Providers Cardiologist:  Debbe Odea, MD { Click to update primary MD,subspecialty MD or APP then REFRESH:1}   History of Present Illness: Tammy Rivers is a 32 y.o. female with past medical history of coronary calcium on chest CT, coronary artery disease, current smoker for 8 years. She is followed by Dr. Azucena Cecil and presents today for follow up regarding chest pain.   She was seen in clinic on 12/25/22 by Dr. Azucena Cecil following a visit to the emergency department for complaints of chest pain, workup was unrevealing. She underwent coronary CTA that showed coronary calcium score of 351, mild proximal LAD stenosis (25%). Echocardiogram with EF 55%, LV with normal function and no RWMA, no valvular abnormalities.   She was last seen in clinic on 02/17/23 for follow up. She reported doing well although continued to have occasional chest discomfort when stressed or upset. She had not started on statin therapy for her cholesterol.   Coronary artery disease: Coronary CTA in 01/2023 indicates mild nonobstructive disease with mild proximal LAD stenosis. Echo shows EF 55%.  Angina? Continue aspirin 81mg  daily and atorvastatin.  Hyperlipidemia: Last lipid profile on 12/25/22 indicates total cholesterol of 148 and LDL 90. Previously started on atorvastatin. Check fasting lipid profile and LFTs.  Continue atorvastatin Current tobacco use:  Currently smokes  Obesity with BMI 35: Encouraged to continue with weight loss journey by decreasing caloric intake and increasing her activity.  ROS: ****** denies chest pain, shortness of breath, lower extremity edema, fatigue, palpitations, melena, hematuria, hemoptysis, diaphoresis, weakness, presyncope, syncope, orthopnea, and PND.   Studies Reviewed: .       Cardiac Studies & Procedures        ECHOCARDIOGRAM  ECHOCARDIOGRAM COMPLETE 02/11/2023  Narrative ECHOCARDIOGRAM REPORT    Patient Name:   Tammy Rivers Date of Exam: 02/11/2023 Medical Rec #:  [id number]      Height:       63.0 in Accession #:    [id number]     Weight:       195.0 lb Date of Birth:  1991-08-31     BSA:          1.913 m Patient Age:    31 years       BP:           80/60 mmHg Patient Gender: F              HR:           54 bpm. Exam Location:  [facility]  Procedure: 2D Echo, 3D Echo, Cardiac Doppler, Color Doppler and Strain Analysis  Indications:    R07.89 Other chest pain  History:        Patient has no prior history of Echocardiogram examinations. Arrythmias:PVC, Signs/Symptoms:Shortness of Breath and Syncope; Risk Factors:Current Smoker.  Sonographer:    Rolland Porter Referring Phys: [id number] BRIAN AGBOR-ETANG  IMPRESSIONS   1. Left ventricular ejection fraction, by estimation, is 55%. The left ventricle has normal function. The left ventricle has no regional wall motion abnormalities. Left ventricular diastolic parameters were normal. The average left ventricular global longitudinal strain is -23.4 %. The global longitudinal strain is normal. 2. Right ventricular systolic function is normal. The right ventricular size is normal. 3. The mitral valve is normal in structure. No evidence of mitral valve regurgitation. 4. The aortic  valve is tricuspid. Aortic valve regurgitation is not visualized.  FINDINGS Left Ventricle: Left ventricular ejection fraction, by estimation, is 55%. The left ventricle has normal function. The left ventricle has no regional wall motion abnormalities. The average left ventricular global longitudinal strain is -23.4 %. The global longitudinal strain is normal. The left ventricular internal cavity size was normal in size. There is no left ventricular hypertrophy. Left ventricular diastolic parameters were normal.  Right Ventricle: The right ventricular size is normal. No  increase in right ventricular wall thickness. Right ventricular systolic function is normal.  Left Atrium: Left atrial size was normal in size.  Right Atrium: Right atrial size was normal in size.  Pericardium: There is no evidence of pericardial effusion.  Mitral Valve: The mitral valve is normal in structure. No evidence of mitral valve regurgitation.  Tricuspid Valve: The tricuspid valve is normal in structure. Tricuspid valve regurgitation is not demonstrated.  Aortic Valve: The aortic valve is tricuspid. Aortic valve regurgitation is not visualized. Aortic valve mean gradient measures 2.0 mmHg. Aortic valve peak gradient measures 4.2 mmHg. Aortic valve area, by VTI measures 3.13 cm.  Pulmonic Valve: The pulmonic valve was normal in structure. Pulmonic valve regurgitation is not visualized.  Aorta: The aortic root and ascending aorta are structurally normal, with no evidence of dilitation.  IAS/Shunts: No atrial level shunt detected by color flow Doppler.   LEFT VENTRICLE PLAX 2D LVIDd:         5.30 cm   Diastology LVIDs:         3.70 cm   LV e' medial:    12.10 cm/s LV PW:         0.60 cm   LV E/e' medial:  8.1 LV IVS:        0.80 cm   LV e' lateral:   17.40 cm/s LVOT diam:     2.00 cm   LV E/e' lateral: 5.6 LV SV:         78 LV SV Index:   41        2D Longitudinal Strain LVOT Area:     3.14 cm  2D Strain GLS Avg:     -23.4 %  3D Volume EF: 3D EF:        58 % LV EDV:       151 ml LV ESV:       64 ml LV SV:        87 ml  RIGHT VENTRICLE RV S prime:     12.90 cm/s TAPSE (M-mode): 2.2 cm  LEFT ATRIUM         Index LA diam:    3.70 cm 1.93 cm/m AORTIC VALVE                    PULMONIC VALVE AV Area (Vmax):    3.08 cm     PV Vmax:       0.79 m/s AV Area (Vmean):   2.89 cm     PV Peak grad:  2.5 mmHg AV Area (VTI):     3.13 cm AV Vmax:           103.00 cm/s AV Vmean:          72.500 cm/s AV VTI:            0.249 m AV Peak Grad:      4.2 mmHg AV Mean Grad:       2.0 mmHg LVOT Vmax:  101.00 cm/s LVOT Vmean:        66.600 cm/s LVOT VTI:          0.248 m LVOT/AV VTI ratio: 1.00  AORTA Ao Root diam: 3.10 cm Ao Asc diam:  2.70 cm  MITRAL VALVE               TRICUSPID VALVE MV Area (PHT): 3.31 cm    TR Peak grad:   15.1 mmHg MV Decel Time: 229 msec    TR Vmax:        194.00 cm/s MV E velocity: 98.13 cm/s MV A velocity: 46.47 cm/s  SHUNTS MV E/A ratio:  2.11        Systemic VTI:  0.25 m Systemic Diam: 2.00 cm  Debbe Odea MD Electronically signed by Debbe Odea MD Signature Date/Time: 02/11/2023/12:58:42 PM    Final     CT SCANS  CT CORONARY MORPH W/CTA COR W/SCORE 01/19/2023  Addendum 01/20/2023 12:08 PM ADDENDUM REPORT: 01/20/2023 12:06  EXAM: OVER-READ INTERPRETATION  CT CHEST  The following report is an over-read performed by radiologist Dr. Karle Barr [facility] on 01/20/2023. This over-read does not include interpretation of cardiac or coronary anatomy or pathology. The coronary CTA interpretation by the cardiologist is attached.  COMPARISON:  11/11/2022  FINDINGS: Aorta normal caliber. No pericardial effusion. Minimal residual thymic tissue in anterior mediastinum. Esophagus unremarkable. No adenopathy. Visualized upper abdomen normal. Lungs clear. No pulmonary infiltrate, pleural effusion, or pneumothorax. Small pneumatocele adjacent to the LEFT major fissure in probably LEFT upper lobe, unchanged. No acute osseous findings.  IMPRESSION: No acute or significant extracardiac findings.   Electronically Signed By: Ulyses Southward M.D. On: 01/20/2023 12:06  Narrative CLINICAL DATA:  Chest pain.  Coronary calcifications  EXAM: Cardiac/Coronary  CTA  TECHNIQUE: The patient was scanned on a Siemens Somatom go.Top scanner.  : A retrospective scan was triggered in the ascending thoracic aorta. Axial non-contrast 3 mm slices were carried out through the heart. The data set was  analyzed on a dedicated work station and scored using the Agatson method. Gantry rotation speed was 330 msecs and collimation was .6 mm. 100mg  of metoprolol and 0.8 mg of sl NTG was given. The 3D data set was reconstructed in 5% intervals of the 60-95 % of the R-R cycle. Diastolic phases were analyzed on a dedicated work station using MPR, MIP and VRT modes. The patient received 75 cc of contrast.  FINDINGS: Aorta:  Normal size.  No calcifications.  No dissection.  Aortic Valve:  Trileaflet.  No calcifications.  Coronary Arteries:  Normal coronary origin. Left dominance.  RCA is a dominant artery. There is no plaque.  Left main gives rise to LAD and LCX arteries. LM has no disease.  LAD has calcified plaque proximally causing mild stenosis (25%). 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  LCX is a non-dominant artery.  There is no plaque.  Other findings:  Normal pulmonary vein drainage into the left atrium.  Normal left atrial appendage without a thrombus.  Normal size of the pulmonary artery.  IMPRESSION: 1. Coronary calcium score of 351.  2. Normal coronary origin with left dominance.  3. Mild proximal LAD stenosis (25%).  4. 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  5. CAD-RADS 2. Mild non-obstructive CAD (25-49%). Consider non-atherosclerotic causes of chest pain. Consider preventive therapy and risk factor modification.  Electronically Signed: By: Debbe Odea M.D. On: 01/19/2023 11:08          ***  Risk Assessment/Calculations:   {Does this patient have ATRIAL FIBRILLATION?:[id number]} No BP recorded.  {Refresh Note OR Click here to enter BP  :1}***       Physical Exam:   VS:  There were no vitals taken for this visit.   Wt Readings from Last 3 Encounters:  02/17/23 200 lb 12.8 oz (91.1 kg)  12/25/22 195 lb (88.5 kg)  11/11/22 190 lb (86.2 kg)    GEN: Well nourished, well developed in no acute  distress NECK: No JVD; No carotid bruits CARDIAC: ***RRR, no murmurs, rubs, gallops RESPIRATORY:  Clear to auscultation without rales, wheezing or rhonchi  ABDOMEN: Soft, non-tender, non-distended EXTREMITIES:  No edema; No deformity   ASSESSMENT AND PLAN: .   ***    {Are you ordering a CV Procedure (e.g. stress test, cath, DCCV, TEE, etc)?   Press F2        :[id number]}  Dispo: ***  Signed, Rip Harbour, NP

## 2023-07-03 ENCOUNTER — Ambulatory Visit: Payer: Medicaid Other | Admitting: Cardiology

## 2023-07-03 DIAGNOSIS — E782 Mixed hyperlipidemia: Secondary | ICD-10-CM

## 2023-07-03 DIAGNOSIS — Z6835 Body mass index (BMI) 35.0-35.9, adult: Secondary | ICD-10-CM

## 2023-07-03 DIAGNOSIS — I2510 Atherosclerotic heart disease of native coronary artery without angina pectoris: Secondary | ICD-10-CM

## 2023-07-03 DIAGNOSIS — F17200 Nicotine dependence, unspecified, uncomplicated: Secondary | ICD-10-CM

## 2023-08-05 NOTE — Progress Notes (Deleted)
Cardiology Office Note:  .   Date:  08/05/2023  ID:  Tammy Rivers, DOB 1991-09-22, MRN [id number] PCP: Aviva Kluver  [facility] Providers Cardiologist:  Debbe Odea, MD { Click to update primary MD,subspecialty MD or APP then REFRESH:1}   History of Present Illness: Tammy Rivers is a 32 y.o. female with a past medical history of coronary calcium on chest CT, coronary disease, current smoker x 8+ years, who is here today for follow-up.  Previous coronary CT a revealed a calcium score of 351.  She had mild nonobstructive coronary artery disease.  With recommendation to consider nonatherosclerotic causes of chest pain and consider preventative therapy and risk factor modification.  She also had an echocardiogram that revealed an LVEF of 55%, no regional wall motion abnormalities, and no valvular abnormalities were noted.  She was last seen in clinic 02/17/23 stating that overall she had been doing well.  She continued to have occasional chest discomfort when she was stressed or upset.  She had been working on smoking cessation but after breaking out in a rash from from NicoDerm patches.  She was started on Wellbutrin for smoking cessation.  There were no other medication changes that were made and no further testing that was ordered.  She returns to clinic today  ROS: 10 point review of systems has been reviewed and considered negative with exception of what is been listed in the HPI  Studies Reviewed: Marland Kitchen        TTE 02/11/23 1. Left ventricular ejection fraction, by estimation, is 55%. The left  ventricle has normal function. The left ventricle has no regional wall  motion abnormalities. Left ventricular diastolic parameters were normal.  The average left ventricular global  longitudinal strain is -23.4 %. The global longitudinal strain is normal.   2. Right ventricular systolic function is normal. The right ventricular  size is normal.   3. The mitral valve is normal in  structure. No evidence of mitral valve  regurgitation.   4. The aortic valve is tricuspid. Aortic valve regurgitation is not  visualized.    Coronary CTA 01/19/23 1. Coronary calcium score of 351.   2. Normal coronary origin with left dominance.   3. Mild proximal LAD stenosis (25%).   4. 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   5. CAD-RADS 2. Mild non-obstructive CAD (25-49%). Consider non-atherosclerotic causes of chest pain. Consider preventive therapy and risk factor modification. Risk Assessment/Calculations:     No BP recorded.  {Refresh Note OR Click here to enter BP  :1}***       Physical Exam:   VS:  There were no vitals taken for this visit.   Wt Readings from Last 3 Encounters:  02/17/23 200 lb 12.8 oz (91.1 kg)  12/25/22 195 lb (88.5 kg)  11/11/22 190 lb (86.2 kg)    GEN: Well nourished, well developed in no acute distress NECK: No JVD; No carotid bruits CARDIAC: ***RRR, no murmurs, rubs, gallops RESPIRATORY:  Clear to auscultation without rales, wheezing or rhonchi  ABDOMEN: Soft, non-tender, non-distended EXTREMITIES:  No edema; No deformity   ASSESSMENT AND PLAN: .   ***    {Are you ordering a CV Procedure (e.g. stress test, cath, DCCV, TEE, etc)?   Press F2        :[id number]}  Dispo: ***  Signed, Pyper Olexa, NP

## 2023-08-07 ENCOUNTER — Encounter: Payer: Self-pay | Admitting: Cardiology

## 2023-08-07 ENCOUNTER — Ambulatory Visit: Payer: Medicaid Other | Attending: Cardiology | Admitting: Cardiology

## 2023-10-14 ENCOUNTER — Ambulatory Visit: Payer: Medicaid Other | Admitting: Family Medicine

## 2023-10-20 ENCOUNTER — Ambulatory Visit: Payer: Medicaid Other | Admitting: Family Medicine

## 2023-10-20 NOTE — Progress Notes (Unsigned)
New Patient Office Visit  Subjective    Patient ID: Tammy Rivers, female    DOB: 1991-08-27  Age: 32 y.o. MRN: [id number]  CC: No chief complaint on file.   HPI KEELY KEMNER presents to establish care ***  Outpatient Encounter Medications as of 10/20/2023  Medication Sig  . Ascorbic Acid (VITAMIN C GUMMIES PO) Take by mouth. Unable to confirm dose  . aspirin EC 81 MG tablet Take 1 tablet (81 mg total) by mouth daily. Swallow whole.  Marland Kitchen atorvastatin (LIPITOR) 10 MG tablet Take 1 tablet (10 mg total) by mouth daily.  Marland Kitchen buPROPion (WELLBUTRIN SR) 150 MG 12 hr tablet Take 1 tablet (150 mg total) by mouth 2 (two) times daily.  . Cholecalciferol (PA VITAMIN D-3 GUMMY PO) Take by mouth. Unable to confirm dose  . cyclobenzaprine (FLEXERIL) 5 MG tablet Take 1 tablet (5 mg total) by mouth 3 (three) times daily as needed.  Marland Kitchen ibuprofen (ADVIL) 800 MG tablet Take 1 tablet (800 mg total) by mouth every 8 (eight) hours as needed.  . iron polysaccharides (NIFEREX) 150 MG capsule Take 1 capsule (150 mg total) by mouth daily.  . magnesium 30 MG tablet Take 30 mg by mouth 2 (two) times daily. Unable to confirm dose  . naproxen (NAPROSYN) 500 MG tablet Take 1 tablet (500 mg total) by mouth 2 (two) times daily with a meal.   No facility-administered encounter medications on file as of 10/20/2023.    Past Medical History:  Diagnosis Date  . Herpes   . PVC (premature ventricular contraction)     Past Surgical History:  Procedure Laterality Date  . ABDOMINOPLASTY  2021  . extraction of wisdom teeth      No family history on file.  Social History   Socioeconomic History  . Marital status: Single    Spouse name: Not on file  . Number of children: Not on file  . Years of education: Not on file  . Highest education level: Not on file  Occupational History    Comment: unemployed  Tobacco Use  . Smoking status: Every Day    Current packs/day: 1.00    Types: Cigarettes  . Smokeless  tobacco: Never  Vaping Use  . Vaping status: Never Used  Substance and Sexual Activity  . Alcohol use: Not Currently    Comment: denies 10/29  . Drug use: No  . Sexual activity: Not Currently    Birth control/protection: None  Other Topics Concern  . Not on file  Social History Narrative  . Not on file   Social Determinants of Health   Financial Resource Strain: Not on file  Food Insecurity: Not on file  Transportation Needs: Not on file  Physical Activity: Not on file  Stress: Not on file  Social Connections: Not on file  Intimate Partner Violence: Not on file    ROS Per HPI      Objective    There were no vitals taken for this visit.  Physical Exam Vitals and nursing note reviewed.  Constitutional:      Appearance: Normal appearance. She is normal weight.  HENT:     Head: Normocephalic and atraumatic.     Right Ear: Tympanic membrane and ear canal normal.     Left Ear: Tympanic membrane and ear canal normal.     Nose: Nose normal.  Eyes:     Extraocular Movements: Extraocular movements intact.     Pupils: Pupils are equal, round, and reactive  to light.  Cardiovascular:     Rate and Rhythm: Normal rate and regular rhythm.     Heart sounds: Normal heart sounds.  Pulmonary:     Effort: Pulmonary effort is normal.     Breath sounds: Normal breath sounds.  Musculoskeletal:        General: Normal range of motion.     Cervical back: Normal range of motion.  Neurological:     General: No focal deficit present.     Mental Status: She is alert and oriented to person, place, and time.  Psychiatric:        Mood and Affect: Mood normal.        Thought Content: Thought content normal.       Assessment & Plan:   There are no diagnoses linked to this encounter.   No follow-ups on file.   Moshe Cipro, FNP

## 2024-01-03 ENCOUNTER — Inpatient Hospital Stay (HOSPITAL_COMMUNITY)
Admission: AD | Admit: 2024-01-03 | Discharge: 2024-01-03 | Disposition: A | Discharge status: Home / Self Care | Payer: Medicaid Other | Attending: Obstetrics and Gynecology | Admitting: Obstetrics and Gynecology

## 2024-01-03 ENCOUNTER — Encounter (HOSPITAL_COMMUNITY): Payer: Self-pay

## 2024-01-03 ENCOUNTER — Inpatient Hospital Stay (HOSPITAL_COMMUNITY): Payer: Medicaid Other

## 2024-01-03 DIAGNOSIS — O209 Hemorrhage in early pregnancy, unspecified: Secondary | ICD-10-CM | POA: Diagnosis present

## 2024-01-03 DIAGNOSIS — O99331 Smoking (tobacco) complicating pregnancy, first trimester: Secondary | ICD-10-CM | POA: Diagnosis not present

## 2024-01-03 DIAGNOSIS — F17210 Nicotine dependence, cigarettes, uncomplicated: Secondary | ICD-10-CM | POA: Insufficient documentation

## 2024-01-03 DIAGNOSIS — R252 Cramp and spasm: Secondary | ICD-10-CM | POA: Insufficient documentation

## 2024-01-03 DIAGNOSIS — R109 Unspecified abdominal pain: Secondary | ICD-10-CM | POA: Diagnosis not present

## 2024-01-03 DIAGNOSIS — O26891 Other specified pregnancy related conditions, first trimester: Secondary | ICD-10-CM | POA: Diagnosis not present

## 2024-01-03 DIAGNOSIS — O26899 Other specified pregnancy related conditions, unspecified trimester: Secondary | ICD-10-CM

## 2024-01-03 DIAGNOSIS — Z3201 Encounter for pregnancy test, result positive: Secondary | ICD-10-CM | POA: Diagnosis present

## 2024-01-03 DIAGNOSIS — Z3491 Encounter for supervision of normal pregnancy, unspecified, first trimester: Secondary | ICD-10-CM

## 2024-01-03 DIAGNOSIS — Z3A01 Less than 8 weeks gestation of pregnancy: Secondary | ICD-10-CM | POA: Insufficient documentation

## 2024-01-03 DIAGNOSIS — Z3481 Encounter for supervision of other normal pregnancy, first trimester: Secondary | ICD-10-CM | POA: Diagnosis present

## 2024-01-03 LAB — CBC
HCT: 38.3 % (ref 36.0–46.0)
Hemoglobin: 13.2 g/dL (ref 12.0–15.0)
MCH: 28.6 pg (ref 26.0–34.0)
MCHC: 34.5 g/dL (ref 30.0–36.0)
MCV: 83.1 fL (ref 80.0–100.0)
Platelets: 261 10*3/uL (ref 150–400)
RBC: 4.61 MIL/uL (ref 3.87–5.11)
RDW: 12.9 % (ref 11.5–15.5)
WBC: 8.5 10*3/uL (ref 4.0–10.5)
nRBC: 0 % (ref 0.0–0.2)

## 2024-01-03 LAB — WET PREP, GENITAL
Clue Cells Wet Prep HPF POC: NONE SEEN
Sperm: NONE SEEN
Trich, Wet Prep: NONE SEEN
WBC, Wet Prep HPF POC: >=10 — AB (ref <10)
Yeast Wet Prep HPF POC: NONE SEEN

## 2024-01-03 LAB — URINALYSIS, ROUTINE W REFLEX MICROSCOPIC
Bilirubin Urine: NEGATIVE
Glucose, UA: NEGATIVE mg/dL
Hgb urine dipstick: NEGATIVE
Ketones, ur: NEGATIVE mg/dL
Leukocytes,Ua: NEGATIVE
Nitrite: NEGATIVE
Protein, ur: NEGATIVE mg/dL
Specific Gravity, Urine: 1.009 (ref 1.005–1.030)
pH: 8 (ref 5.0–8.0)

## 2024-01-03 LAB — HCG, QUANTITATIVE, PREGNANCY: hCG, Beta Chain, Quant, S: 18017 m[IU]/mL — ABNORMAL HIGH (ref <5)

## 2024-01-03 LAB — POCT PREGNANCY, URINE: Preg Test, Ur: POSITIVE — AB

## 2024-01-03 NOTE — MAU Provider Note (Signed)
 Chief Complaint:  Abdominal Pain and Vaginal Bleeding   HPI      Tammy Rivers is a 33 y.o. G3P1011 at [redacted]w[redacted]d who presents to maternity admissions reporting vaginal bleeding that started 4 days ago. Denies active Red VB, LOF and reports mild cramping. UPT Positive  Pregnancy Course: Un established  Past Medical History:  Diagnosis Date   Herpes    PVC (premature ventricular contraction)    OB History  Gravida Para Term Preterm AB Living  3 1 1  1 1   SAB IAB Ectopic Multiple Live Births     0 1    # Outcome Date GA Lbr Len/2nd Weight Sex Type Anes PTL Lv  3 Current           2 Term 04/06/21 [redacted]w[redacted]d 07:34 / 00:19 3691 g M Vag-Spont EPI, Local  LIV  1 AB            Past Surgical History:  Procedure Laterality Date   ABDOMINOPLASTY  2021   extraction of wisdom teeth     No family history on file. Social History   Tobacco Use   Smoking status: Every Day    Current packs/day: 1.00    Types: Cigarettes   Smokeless tobacco: Never  Vaping Use   Vaping status: Never Used  Substance Use Topics   Alcohol use: Not Currently    Comment: denies 10/29   Drug use: No   No Known Allergies No medications prior to admission.    I have reviewed patient's Past Medical Hx, Surgical Hx, Family Hx, Social Hx, medications and allergies.   ROS  Pertinent items noted in HPI and remainder of comprehensive ROS otherwise negative.   PHYSICAL EXAM  No data found.   Constitutional: Well-developed, well-nourished female in no acute distress.  Cardiovascular: normal rate & rhythm, warm and well-perfused Respiratory: normal effort, no problems with respiration noted GI: Abd soft, non-tender, gravid MS: Extremities nontender, no edema, normal ROM Neurologic: Alert and oriented x 4.  GU: no CVA tenderness Pelvic: NEFG, physiologic discharge, no blood, cervix clean.      Fetal Tracing: Via ultrasound    Labs:    Recent Results (from the past 2160 hours)  Pregnancy, urine POC      Status: Abnormal   Collection Time: 01/03/24  4:30 PM  Result Value Ref Range   Preg Test, Ur POSITIVE (A) NEGATIVE    Comment:        THE SENSITIVITY OF THIS METHODOLOGY IS >24 mIU/mL   Urinalysis, Routine w reflex microscopic -Urine, Clean Catch     Status: None   Collection Time: 01/03/24  5:06 PM  Result Value Ref Range   Color, Urine YELLOW YELLOW   APPearance CLEAR CLEAR   Specific Gravity, Urine 1.009 1.005 - 1.030   pH 8.0 5.0 - 8.0   Glucose, UA NEGATIVE NEGATIVE mg/dL   Hgb urine dipstick NEGATIVE NEGATIVE   Bilirubin Urine NEGATIVE NEGATIVE   Ketones, ur NEGATIVE NEGATIVE mg/dL   Protein, ur NEGATIVE NEGATIVE mg/dL   Nitrite NEGATIVE NEGATIVE   Leukocytes,Ua NEGATIVE NEGATIVE    Comment: Performed at [facility], [address], [place], [place] [address]  CBC     Status: None   Collection Time: 01/03/24  5:59 PM  Result Value Ref Range   WBC 8.5 4.0 - 10.5 K/uL   RBC 4.61 3.87 - 5.11 MIL/uL   Hemoglobin 13.2 12.0 - 15.0 g/dL   HCT 60.4 54.0 -  46.0 %   MCV 83.1 80.0 - 100.0 fL   MCH 28.6 26.0 - 34.0 pg   MCHC 34.5 30.0 - 36.0 g/dL   RDW 16.1 09.6 - 04.5 %   Platelets 261 150 - 400 K/uL   nRBC 0.0 0.0 - 0.2 %    Comment: Performed at [facility], [address] [place], [place] [address]  hCG, quantitative, pregnancy     Status: Abnormal   Collection Time: 01/03/24  5:59 PM  Result Value Ref Range   hCG, Beta Chain, Quant, S 18,017 (H) <5 mIU/mL    Comment:          GEST. AGE      CONC.  (mIU/mL)   <=1 WEEK        5 - 50     2 WEEKS       50 - 500     3 WEEKS       100 - 10,000     4 WEEKS     1,000 - 30,000     5 WEEKS     3,500 - 115,000   6-8 WEEKS     12,000 - 270,000    12 WEEKS     15,000 - 220,000        FEMALE AND NON-PREGNANT FEMALE:     LESS THAN 5 mIU/mL Performed at [facility], [address], [place], [place] [address]   ABO/Rh     Status: None   Collection Time: 01/03/24  5:59 PM  Result Value Ref Range    ABO/RH(D) O POS    No rh immune globuloin      NOT A RH IMMUNE GLOBULIN CANDIDATE, PT RH POSITIVE Performed at [facility], [address] [place], [place] [address]   GC/Chlamydia probe amp ([facility])not at [facility]     Status: None   Collection Time: 01/03/24  7:51 PM  Result Value Ref Range   Neisseria Gonorrhea Negative    Chlamydia Negative    Comment Normal Reference Ranger Chlamydia - Negative    Comment      Normal Reference Range Neisseria Gonorrhea - Negative  Wet prep, genital     Status: Abnormal   Collection Time: 01/03/24  7:59 PM   Specimen: PATH Cytology Cervicovaginal Ancillary Only  Result Value Ref Range   Yeast Wet Prep HPF POC NONE SEEN NONE SEEN   Trich, Wet Prep NONE SEEN NONE SEEN   Clue Cells Wet Prep HPF POC NONE SEEN NONE SEEN   WBC, Wet Prep HPF POC >=10 (A) <10   Sperm NONE SEEN     Comment: Performed at [facility], [address] [place], [place] [address]     Imaging:  Reviewed    I have reviewed the patient chart and performed the physical exam . I have ordered & interpreted the lab results and reviewed and interpreted the images c/w a Gestational sac IUP Medications ordered as stated below.  A/P as described below.  Counseling and education provided and patient agreeable  with plan as described below. Verbalized understanding.    MDM & MAU COURSE  MDM:  HIGH  Labs: unremarkable  Imaging c/w IUP    I have reviewed the patient chart and performed the physical exam . I have ordered & interpreted the lab results and reviewed and interpreted the images c/w an IUP Medications ordered as stated below.  A/P as described below.  Counseling  and education provided and patient agreeable  with plan as described below. Verbalized understanding.     MAU Course: Orders Placed This Encounter  Procedures   Wet prep, genital   US OB LESS THAN 14 WEEKS WITH OB TRANSVAGINAL   Urinalysis, Routine w reflex microscopic -Urine, Clean Catch    CBC   hCG, quantitative, pregnancy   Pregnancy, urine POC   ABO/Rh   Discharge patient Discharge disposition: 01-Home or Self Care; Discharge patient date: 01/03/2024   No orders of the defined types were placed in this encounter.   ASSESSMENT   1. Vaginal bleeding affecting early pregnancy   2. Normal intrauterine pregnancy on prenatal ultrasound in first trimester   3. Cramping affecting pregnancy, antepartum   4. [redacted] weeks gestation of pregnancy     PLAN  Discharge home in stable condition with return precautions.  Strict bleeding Precautions Please see AVS for complete verbal and written information given   Establish NOB    Allergies as of 01/03/2024   No Known Allergies      Medication List     STOP taking these medications    ibuprofen 800 MG tablet Commonly known as: ADVIL   naproxen 500 MG tablet Commonly known as: Naprosyn       TAKE these medications    aspirin EC 81 MG tablet Take 1 tablet (81 mg total) by mouth daily. Swallow whole.   atorvastatin 10 MG tablet Commonly known as: LIPITOR Take 1 tablet (10 mg total) by mouth daily.   buPROPion 150 MG 12 hr tablet Commonly known as: Wellbutrin SR Take 1 tablet (150 mg total) by mouth 2 (two) times daily.   cyclobenzaprine 5 MG tablet Commonly known as: FLEXERIL Take 1 tablet (5 mg total) by mouth 3 (three) times daily as needed.   iron polysaccharides 150 MG capsule Commonly known as: NIFEREX Take 1 capsule (150 mg total) by mouth daily.   magnesium 30 MG tablet Take 30 mg by mouth 2 (two) times daily. Unable to confirm dose   PA VITAMIN D-3 GUMMY PO Take by mouth. Unable to confirm dose   VITAMIN C GUMMIES PO Take by mouth. Unable to confirm dose        Marcell Barlow, MSN, [facility], [facility] [employer]

## 2024-01-03 NOTE — MAU Note (Signed)
.  Tammy Rivers is a 33 y.o. at Unknown here in MAU reporting:  vag bleeding that started 4 days ago, pt reports bleeding is brown.  Also reports cramping.     Onset of complaint: 4 days  Pain score: 4 There were no vitals filed for this visit.    Lab orders placed from triage: ua

## 2024-01-03 NOTE — Discharge Instructions (Signed)
 Safe Medications in Pregnancy   Acne:  Benzoyl Peroxide  Salicylic Acid   Backache/Headache:  Tylenol: 2 regular strength every 4 hours OR               2 Extra strength every 6 hours   Colds/Coughs/Allergies:  Benadryl (alcohol free) 25 mg every 6 hours as needed  Breath right strips  Claritin  Cepacol throat lozenges  Chloraseptic throat spray  Cold-Eeze- up to three times per day  Cough drops, alcohol free  Flonase (by prescription only)  Guaifenesin  Mucinex  Robitussin DM (plain only, alcohol free)  Saline nasal spray/drops  Sudafed (pseudoephedrine) & Actifed * use only after [redacted] weeks gestation and if you do not have high blood pressure  Tylenol  Vicks Vaporub  Zinc lozenges  Zyrtec   Constipation:  Colace  Ducolax suppositories  Fleet enema  Glycerin suppositories  Metamucil  Milk of magnesia  Miralax  Senokot  Smooth move tea   Diarrhea:  Kaopectate  Imodium A-D   *NO pepto Bismol   Hemorrhoids:  Anusol  Anusol HC  Preparation H  Tucks   Indigestion:  Tums  Maalox  Mylanta  Zantac  Pepcid   Insomnia:  Benadryl (alcohol free) 25mg  every 6 hours as needed  Tylenol PM  Unisom, no Gelcaps   Leg Cramps:  Tums  MagGel   Nausea/Vomiting:  Bonine  Dramamine  Emetrol  Ginger extract  Sea bands  Meclizine  Nausea medication to take during pregnancy:  Unisom (doxylamine succinate 25 mg tablets) Take one tablet daily at bedtime. If symptoms are not adequately controlled, the dose can be increased to a maximum recommended dose of two tablets daily (1/2 tablet in the morning, 1/2 tablet mid-afternoon and one at bedtime).  Vitamin B6 100mg  tablets. Take one tablet twice a day (up to 200 mg per day).   Skin Rashes:  Aveeno products  Benadryl cream or 25mg  every 6 hours as needed  Calamine Lotion  1% cortisone cream   Yeast infection:  Gyne-lotrimin 7  Monistat 7    **If taking multiple medications, please check labels to avoid  duplicating the same active ingredients  **take medication as directed on the label  ** Do not exceed 4000 mg of tylenol in 24 hours  **Do not take medications that contain aspirin or ibuprofen            We highly recommend childbirth education to help you plan for labor and begin practicing coping skills (which will be needed with or without pain meds).  [place] Childbirth Education Options: Sign up by visiting [email or web address]  Childbirth ~ Self-Paced eClass (English and Spanish) This online class offers you the freedom to complete a childbirth education series in the comfort of your own home at your own pace.  Childbirth Class (In-Person 4-Week Series  or on Saturdays, Virtual 4-Week Series ~ [place]) This interactive in-person class series will help you and your partner prepare for your birth experience. Topics include: Labor & Birth, Comfort Measures, Breathing Techniques, Massage, Medical Interventions, Pain Management Options, Cesarean Birth, Postpartum Care, and Newborn Care  Comfort Techniques for Labor ~ In-Person Class [facility]) This interactive class is designed for parents-to-be who want to learn & practice hands-on skills to help relieve some of the discomfort of labor and encourage their babies to rotate toward the best position for birth. Moms and their partners will be able to try a variety of labor positions with birth balls and rebozos as well as Financial risk analyst  breathing, relaxation, and visualization techniques.  Natural Childbirth Class (In-Person 5-Week Series, In-Person on Saturdays or Virtual 5-Week Series ~ [place]) This class series is designed for expectant parents who want to learn and practice natural methods of coping with the process of labor and childbirth.  Cesarean Birth Self-Paced eClass (English and Spanish) This online course provides comprehensive information you can trust as you prepare for a possible cesarean birth. In this class, you'll  learn how to make your birth and recovery comfortable and joyful through instructive video clips, animations, and activities.  Waterbirth ~ Airline pilot Interested in a waterbirth? In addition to a consultation with your credentialed waterbirth provider, this free, informational online class will help you discover whether waterbirth is the right fit for you. Not all obstetrical practices offer waterbirth, so check with your healthcare provider.  Tour Probation officer) - [facility] [employer] our 4 minute video tour of [facility] located in [place].   [place] Parenting Education Options:  Pregnancy 101 (Virtual) Congratulations on your pregnancy! This class is geared toward moms in their first trimester, but everyone is welcome. We are excited to guide you through all aspects of supporting a healthy pregnancy. You will learn what to expect at routine prenatal care appointments, common postpartum adjustments, basic infant safety, and breastfeeding.  Successful Partnering & Parenting ~ In-Person Workshop [facility]) This workshop inspires and equips partners of all economic levels, ages, and cultures to confidently care for their infants, support the birthing persons, and navigate their own transformations into new partners and parents. Learning activities are geared towards supporting partner, but moms are welcome to attend.  'Baby & Me' Parenting Group (Virtual on Wednesdays at 11am) Enjoy this time discussing newborn & infant parenting topics and family adjustment issues with other new parents in a relaxed environment. Each week brings a new speaker or baby-centered activity. This group offers support and connection to parents as they journey through the adjustments and struggles of that sometimes overwhelming first year after the birth of a child.  Baby Safety, CPR, & Choking Class ~ Virtual This life-saving information is meant to  encourage parents as they learn important safety and prevention tips as well as infant CPR and relief of choking.  Breastfeeding Class (In-Person in [place] or [employer]) Families learn what to expect in the first days and weeks of breastfeeding your newborn. IF YOU ARE AN EMPLOYEE TAKING THIS CLASS FOR CREDIT, DO NOT register yourself. Please e-mail [email or web address].   Breastfeeding Self-Paced eClass (English & Spanish) Families learn what to expect in the first days and weeks of breastfeeding your newborn.  Caring for Baby ~ In-Person, Virtual or Self-Paced Class This in-person class is for both expectant and adoptive parents who want to learn and practice the most up-to-date newborn care for their babies. Focus is on birth through the first six weeks of life.  CPR & Choking Relief for Infants & Children ~ In-Person Class [facility]) This in-person course is designed for any parent, expectant parent, or adult who cares for infants or children. Participants learn and demonstrate cardiopulmonary resuscitation and choking relief procedures for both infants and children.  Grandparent Love ~ In-Person Class Grandparents will learn the most updated infant care and safety recommendations. They will discover ways to support their own children during the transition into the parenting role and receive tips on communicating with the new parents.  [place] Parenting Support Group Options:  Bereavement Grief Support Group (Pregnancy/Infant Loss) -  Virtual This is an ongoing experience that meets once a month and is designed to help you honor the past, assist you in discovering tools to strengthen you today, and aid you in developing hope for the future.  Breastfeeding & Pumping Support Group (In-Person on Thursdays at 12pm or Virtual on Tuesdays at 5pm) Join [place] in-person each Thursday starting June 1st, 2023 at 12pm! This support group is free for all families looking for breastfeeding  and/or pumping support.   Community-Based Childbirth Education Options:  [facility] Classes:  Childbirth education classes can help you get ready for a positive parenting experience. You can also meet other expectant parents and get free stuff for your baby. Each class runs for five weeks on the same night and costs $45 for the mother-to-be and her support person. Medicaid covers the cost if you are eligible. Call [phone] to register.  [facility] offers a variety of programs for the [employer] and is another great way to get connected. Please go to [email or web address] for more information.  Childbirth With A Twist! Be informed of your options, get educated on birth, understand what your body is doing, learn how to cope, and have a lot of fun and laughs all while doing it either from the comfort of your couch OR in our cozy office and classroom space near the [place] airport. If you are taking a virtual class, then class is taught LIVE, so you can ask questions and receive answers in real-time from an experienced doula and childbirth educator.  This virtual childbirth education class will meet for five instruction times online.  Although we are based in [place], [place], this virtual class is open to anyone in the world. Please visit: [email or web address] for more information.  Books We Love: The Doula Guide to Childbirth by Harland German and Otila Back The First-Time Parent's Childbirth Handbook by Dr. Amie Critchley, CNM The Birth Partner by Truddie Crumble    Commonly Asked Questions During Pregnancy  How Will I Feel When I'm Pregnant? Pregnancy symptoms in the first trimester of pregnancy may not appear until the middle or end of the second month. Hormonal changes will cause tenderness in your breasts, and you may begin to feel more tired than usual. Food cravings, an increase in the need to  urinate, and morning sickness may all be more noticeable.  Pregnancy symptoms in the second trimester are more prominent. You may start to feel the baby move and become more active. Dental issues, nasal/sinus problems, and skin irritations can begin to appear. Heartburn, leg cramps, dizziness, and a vaginal discharge are also common. Every woman is different when it comes to the symptoms they experience, and some may not experience any at all. Pregnancy symptoms in the third trimester can include increased frequency in urination, leg cramps, constipation, ligament pain in the abdomen, and weight gain. Back pain and Braxton Hicks contractions will become increasingly more common.  Why is nutrition during pregnancy important? Eating well is one of the best things you can do during pregnancy. Good nutrition helps you handle the extra demands on your body as your pregnancy progresses. The goal is to balance getting enough nutrients to support the growth of your fetus and maintaining a healthy weight.  How much water should I drink during pregnancy? During pregnancy you should drink 8 to 12 cups (64 to 96 ounces) of water every day. Water has many benefits. It aids digestion and helps form the amniotic fluid around the  fetus. Water also helps nutrients circulate in the body and helps waste leave the body.  What can I do to help with nausea? Eat dry toast or crackers in the morning before you get out of bed to avoid moving around on an empty stomach. Eat five or six "mini meals" a day to ensure that your stomach is never empty. Eat frequent bites of foods like nuts, fruits, or crackers.  What can help with constipation during pregnancy? Constipation is common near the end of pregnancy. Eating more foods with fiber can help fight constipation. Fiber is found in fruits, vegetables, whole grains, beans, nuts, and seeds. You should aim for about 25 grams of fiber in your diet each day. Drink a lot of water as  you increase your fiber intake.  How much coffee can I drink while I'm pregnant? Research suggests that moderate caffeine consumption (less than 200 milligrams per day) does not cause miscarriage or preterm birth. That's the amount in one 12-ounce cup of coffee. Remember that caffeine also is found in tea, chocolate, energy drinks, and soft drinks. Caffeine can interfere with sleep and contribute to nausea and light-headedness. Caffeine also can increase urination and lead to dehydration.  What can I do to prevent or ease back pain during pregnancy? There are several things you can do to prevent or ease back pain. For example, wear supportive clothing and shoes. Pay attention to your position when sitting, sleeping, and lifting things. If you need to stand for a long time, rest one foot on a stool or a box to take the strain off your back. You also can use heat or cold to soothe sore muscles.  Is it safe to exercise during pregnancy? If you are healthy and your pregnancy is normal, it is safe to continue or start regular physical activity. Physical activity does not increase your risk of miscarriage, low birth weight, or early delivery. It's still important to discuss exercise with your ob-gyn provider during your early prenatal visits.   What are the benefits of exercise during pregnancy? Regular exercise during pregnancy benefits you and your fetus in these key ways: Reduces back pain Eases constipation May decrease your risk of gestational diabetes, preeclampsia, and cesarean birth Promotes healthy weight gain during pregnancy Improves your overall fitness and strengthens your heart and blood vessels Helps you to lose the baby weight after your baby is born  Is it safe to dye my hair during pregnancy? Yes, it's safe. Only a small amount of chemicals from hair dye is absorbed through the scalp.  Is it safe to keep a cat during pregnancy? Yes, you can keep your cat. You may have heard that  cat feces can carry the infection toxoplasmosis. This infection is only found in cats who go outdoors and hunt prey, such as mice and other rodents. If you do have a cat who goes outdoors or eats prey, have someone else take over daily cleaning the litter box. This will keep you away from any cat feces. If you have an indoor cat who only eats cat food and doesn't have contact with outside animals, your risk of toxoplasmosis is very low.  What substances should I avoid during pregnancy? During pregnancy, women should not use tobacco, alcohol, marijuana, illegal drugs, or prescription medications for nonmedical reasons. Avoiding these substances and getting regular prenatal care are important to having a healthy pregnancy and a healthy baby.   What foods to I need to avoid in pregnancy? To help  prevent listeriosis, avoid eating the following foods while you are pregnant: Unpasteurized milk and foods made with unpasteurized milk, including soft cheeses Hot dogs and luncheon meats, unless they are heated until steaming hot just before serving Unwashed raw produce such as fruits and vegetables  Avoid all raw and undercooked seafood, eggs, meat, and poultry while you are pregnant. Do not eat sushi made with raw fish (cooked sushi is safe). Cooking and pasteurization are the only ways to kill Listeria.  Limit your exposure to mercury by not eating bigeye tuna, king mackerel, marlin, orange roughy, shark, swordfish, or tilefish. Limit eating white (albacore) tuna to 6 ounces a week. You do not have to avoid all fish during pregnancy. In fact, fish and shellfish are nutritious foods with vital nutrients for a pregnant woman and her fetus. Be sure to eat at least 8-12 ounces of low-mercury fish and shellfish per week.  Is travel safe to during pregnancy? In most cases, pregnant women can travel safely until close to their due dates. But travel may not be recommended for women who have pregnancy complications.  If you are planning a trip, talk with your (ob-gyn) provider. And no matter how you choose to travel, think ahead about your comfort and safety.  Can I use a sauna or hot tub early in pregnancy? It's best not to. Your core body temperature rises when you use saunas and hot tubs. This rise in temperature can be harmful for your fetus.  Can I get a massage while pregnant? Yes. Massage is a good way to relax and improve circulation. The best position for a massage while you're pregnant is lying on your side, rather than facedown. Some massage tables have a cut-out for the belly, allowing you to lie facedown comfortably. Tell your massage therapist that you're pregnant if you're not showing yet. Many health spas offer special prenatal massages done by therapists who are trained to work on pregnant women.  Is Having Dental Work While Pregnant Safe? Pregnancy and dental work questions are common for expecting moms. Preventive dental cleanings and annual exams during pregnancy are not only safe but are recommended. The rise in hormone levels during pregnancy causes the gums to swell, bleed, and trap food causing increased irritation to your gums. Preventive dental work while pregnant is essential to avoid oral infections such as gum disease, which has been linked to preterm birth. The American Dental Association (ADA) recommends pregnant women eat a balanced diet, brush their teeth thoroughly with ADA-approved fluoride toothpaste twice a day, and floss daily. Have preventive exams and cleanings during your pregnancy. Let your dentist know you are pregnant. Postpone non-emergency dental work until the second trimester or after delivery, if possible. Elective procedures should be postponed until after the delivery.

## 2024-01-04 LAB — GC/CHLAMYDIA PROBE AMP (~~LOC~~) NOT AT ARMC
Chlamydia: NEGATIVE
Comment: NEGATIVE
Comment: NORMAL
Neisseria Gonorrhea: NEGATIVE

## 2024-01-04 LAB — ABO/RH: ABO/RH(D): O POS
